# Patient Record
Sex: Female | Born: 1940
Health system: Southern US, Community
[De-identification: ages and names within clinical notes are randomized; demographics above are authoritative.]

## PROBLEM LIST (undated history)

## (undated) DIAGNOSIS — M1990 Unspecified osteoarthritis, unspecified site: Secondary | ICD-10-CM

## (undated) DIAGNOSIS — M797 Fibromyalgia: Secondary | ICD-10-CM

## (undated) DIAGNOSIS — E559 Vitamin D deficiency, unspecified: Secondary | ICD-10-CM

## (undated) DIAGNOSIS — E039 Hypothyroidism, unspecified: Secondary | ICD-10-CM

## (undated) DIAGNOSIS — M8580 Other specified disorders of bone density and structure, unspecified site: Secondary | ICD-10-CM

## (undated) DIAGNOSIS — I10 Essential (primary) hypertension: Secondary | ICD-10-CM

## (undated) DIAGNOSIS — J45909 Unspecified asthma, uncomplicated: Secondary | ICD-10-CM

## (undated) DIAGNOSIS — K219 Gastro-esophageal reflux disease without esophagitis: Secondary | ICD-10-CM

## (undated) HISTORY — PX: EYE SURGERY: SHX253

## (undated) HISTORY — PX: TUBAL LIGATION: SHX77

## (undated) HISTORY — DX: Vitamin D deficiency, unspecified: E55.9

## (undated) HISTORY — DX: Fibromyalgia: M79.7

## (undated) HISTORY — DX: Unspecified asthma, uncomplicated: J45.909

## (undated) HISTORY — DX: Gastro-esophageal reflux disease without esophagitis: K21.9

## (undated) HISTORY — DX: Essential (primary) hypertension: I10

## (undated) HISTORY — DX: Other specified disorders of bone density and structure, unspecified site: M85.80

## (undated) HISTORY — DX: Unspecified osteoarthritis, unspecified site: M19.90

## (undated) HISTORY — PX: APPENDECTOMY: SHX54

---

## 2008-10-14 DIAGNOSIS — H4089 Other specified glaucoma: Secondary | ICD-10-CM | POA: Insufficient documentation

## 2014-06-03 DIAGNOSIS — H18459 Nodular corneal degeneration, unspecified eye: Secondary | ICD-10-CM | POA: Insufficient documentation

## 2014-09-07 DIAGNOSIS — Z531 Procedure and treatment not carried out because of patient's decision for reasons of belief and group pressure: Secondary | ICD-10-CM | POA: Insufficient documentation

## 2014-10-19 DIAGNOSIS — H402223 Chronic angle-closure glaucoma, left eye, severe stage: Secondary | ICD-10-CM | POA: Diagnosis not present

## 2014-10-19 DIAGNOSIS — H2512 Age-related nuclear cataract, left eye: Secondary | ICD-10-CM | POA: Diagnosis not present

## 2014-11-11 DIAGNOSIS — J209 Acute bronchitis, unspecified: Secondary | ICD-10-CM | POA: Diagnosis not present

## 2014-11-11 DIAGNOSIS — J4521 Mild intermittent asthma with (acute) exacerbation: Secondary | ICD-10-CM | POA: Diagnosis not present

## 2014-11-16 DIAGNOSIS — M8580 Other specified disorders of bone density and structure, unspecified site: Secondary | ICD-10-CM | POA: Diagnosis not present

## 2014-11-16 DIAGNOSIS — J45909 Unspecified asthma, uncomplicated: Secondary | ICD-10-CM | POA: Diagnosis not present

## 2014-11-16 DIAGNOSIS — E559 Vitamin D deficiency, unspecified: Secondary | ICD-10-CM | POA: Diagnosis not present

## 2014-11-16 DIAGNOSIS — Z0000 Encounter for general adult medical examination without abnormal findings: Secondary | ICD-10-CM | POA: Diagnosis not present

## 2014-11-16 DIAGNOSIS — E780 Pure hypercholesterolemia: Secondary | ICD-10-CM | POA: Diagnosis not present

## 2014-11-16 DIAGNOSIS — R5383 Other fatigue: Secondary | ICD-10-CM | POA: Diagnosis not present

## 2014-11-16 DIAGNOSIS — R5381 Other malaise: Secondary | ICD-10-CM | POA: Diagnosis not present

## 2014-11-16 DIAGNOSIS — Z23 Encounter for immunization: Secondary | ICD-10-CM | POA: Diagnosis not present

## 2015-02-07 DIAGNOSIS — H409 Unspecified glaucoma: Secondary | ICD-10-CM | POA: Diagnosis not present

## 2015-02-07 DIAGNOSIS — M533 Sacrococcygeal disorders, not elsewhere classified: Secondary | ICD-10-CM | POA: Diagnosis not present

## 2015-02-07 DIAGNOSIS — M797 Fibromyalgia: Secondary | ICD-10-CM | POA: Diagnosis not present

## 2015-02-07 DIAGNOSIS — M47812 Spondylosis without myelopathy or radiculopathy, cervical region: Secondary | ICD-10-CM | POA: Diagnosis not present

## 2015-02-07 DIAGNOSIS — M150 Primary generalized (osteo)arthritis: Secondary | ICD-10-CM | POA: Diagnosis not present

## 2015-02-07 DIAGNOSIS — M47816 Spondylosis without myelopathy or radiculopathy, lumbar region: Secondary | ICD-10-CM | POA: Diagnosis not present

## 2015-02-23 DIAGNOSIS — H4053X3 Glaucoma secondary to other eye disorders, bilateral, severe stage: Secondary | ICD-10-CM | POA: Diagnosis not present

## 2015-02-28 DIAGNOSIS — M533 Sacrococcygeal disorders, not elsewhere classified: Secondary | ICD-10-CM | POA: Diagnosis not present

## 2015-03-21 DIAGNOSIS — M533 Sacrococcygeal disorders, not elsewhere classified: Secondary | ICD-10-CM | POA: Diagnosis not present

## 2015-03-21 DIAGNOSIS — M47816 Spondylosis without myelopathy or radiculopathy, lumbar region: Secondary | ICD-10-CM | POA: Diagnosis not present

## 2015-03-22 DIAGNOSIS — H4011X3 Primary open-angle glaucoma, severe stage: Secondary | ICD-10-CM | POA: Diagnosis not present

## 2015-03-22 DIAGNOSIS — M1990 Unspecified osteoarthritis, unspecified site: Secondary | ICD-10-CM | POA: Diagnosis not present

## 2015-03-22 DIAGNOSIS — M797 Fibromyalgia: Secondary | ICD-10-CM | POA: Diagnosis not present

## 2015-03-22 DIAGNOSIS — H4011X0 Primary open-angle glaucoma, stage unspecified: Secondary | ICD-10-CM | POA: Diagnosis not present

## 2015-03-22 DIAGNOSIS — Z87891 Personal history of nicotine dependence: Secondary | ICD-10-CM | POA: Diagnosis not present

## 2015-03-22 DIAGNOSIS — J45909 Unspecified asthma, uncomplicated: Secondary | ICD-10-CM | POA: Diagnosis not present

## 2015-03-22 DIAGNOSIS — K219 Gastro-esophageal reflux disease without esophagitis: Secondary | ICD-10-CM | POA: Diagnosis not present

## 2015-03-22 DIAGNOSIS — H2512 Age-related nuclear cataract, left eye: Secondary | ICD-10-CM | POA: Diagnosis not present

## 2015-03-22 DIAGNOSIS — I10 Essential (primary) hypertension: Secondary | ICD-10-CM | POA: Diagnosis not present

## 2015-03-22 DIAGNOSIS — M810 Age-related osteoporosis without current pathological fracture: Secondary | ICD-10-CM | POA: Diagnosis not present

## 2015-06-20 DIAGNOSIS — Z961 Presence of intraocular lens: Secondary | ICD-10-CM | POA: Diagnosis not present

## 2015-06-20 DIAGNOSIS — H4053X3 Glaucoma secondary to other eye disorders, bilateral, severe stage: Secondary | ICD-10-CM | POA: Diagnosis not present

## 2015-07-20 DIAGNOSIS — E559 Vitamin D deficiency, unspecified: Secondary | ICD-10-CM | POA: Diagnosis not present

## 2015-07-20 DIAGNOSIS — Z23 Encounter for immunization: Secondary | ICD-10-CM | POA: Diagnosis not present

## 2015-07-20 DIAGNOSIS — Z1389 Encounter for screening for other disorder: Secondary | ICD-10-CM | POA: Diagnosis not present

## 2015-07-20 DIAGNOSIS — E782 Mixed hyperlipidemia: Secondary | ICD-10-CM | POA: Diagnosis not present

## 2015-07-20 DIAGNOSIS — J45909 Unspecified asthma, uncomplicated: Secondary | ICD-10-CM | POA: Diagnosis not present

## 2015-07-20 DIAGNOSIS — Z9181 History of falling: Secondary | ICD-10-CM | POA: Diagnosis not present

## 2015-08-01 DIAGNOSIS — B349 Viral infection, unspecified: Secondary | ICD-10-CM | POA: Diagnosis not present

## 2015-08-01 DIAGNOSIS — J029 Acute pharyngitis, unspecified: Secondary | ICD-10-CM | POA: Diagnosis not present

## 2015-09-29 DIAGNOSIS — R109 Unspecified abdominal pain: Secondary | ICD-10-CM | POA: Diagnosis not present

## 2015-09-29 DIAGNOSIS — F419 Anxiety disorder, unspecified: Secondary | ICD-10-CM | POA: Diagnosis not present

## 2015-10-10 DIAGNOSIS — R1032 Left lower quadrant pain: Secondary | ICD-10-CM | POA: Diagnosis not present

## 2016-02-06 DIAGNOSIS — M533 Sacrococcygeal disorders, not elsewhere classified: Secondary | ICD-10-CM | POA: Diagnosis not present

## 2016-02-06 DIAGNOSIS — M47816 Spondylosis without myelopathy or radiculopathy, lumbar region: Secondary | ICD-10-CM | POA: Diagnosis not present

## 2016-02-06 DIAGNOSIS — M797 Fibromyalgia: Secondary | ICD-10-CM | POA: Diagnosis not present

## 2016-02-16 DIAGNOSIS — M533 Sacrococcygeal disorders, not elsewhere classified: Secondary | ICD-10-CM | POA: Diagnosis not present

## 2016-03-20 DIAGNOSIS — Z79899 Other long term (current) drug therapy: Secondary | ICD-10-CM | POA: Diagnosis not present

## 2016-03-20 DIAGNOSIS — R5383 Other fatigue: Secondary | ICD-10-CM | POA: Diagnosis not present

## 2016-03-20 DIAGNOSIS — E559 Vitamin D deficiency, unspecified: Secondary | ICD-10-CM | POA: Diagnosis not present

## 2016-03-20 DIAGNOSIS — H8120 Vestibular neuronitis, unspecified ear: Secondary | ICD-10-CM | POA: Diagnosis not present

## 2016-03-20 DIAGNOSIS — Z0000 Encounter for general adult medical examination without abnormal findings: Secondary | ICD-10-CM | POA: Diagnosis not present

## 2016-05-06 DIAGNOSIS — M797 Fibromyalgia: Secondary | ICD-10-CM | POA: Diagnosis not present

## 2016-05-06 DIAGNOSIS — M533 Sacrococcygeal disorders, not elsewhere classified: Secondary | ICD-10-CM | POA: Diagnosis not present

## 2016-05-06 DIAGNOSIS — M47816 Spondylosis without myelopathy or radiculopathy, lumbar region: Secondary | ICD-10-CM | POA: Diagnosis not present

## 2016-05-06 DIAGNOSIS — M150 Primary generalized (osteo)arthritis: Secondary | ICD-10-CM | POA: Diagnosis not present

## 2016-05-15 DIAGNOSIS — B079 Viral wart, unspecified: Secondary | ICD-10-CM | POA: Diagnosis not present

## 2016-05-27 DIAGNOSIS — M533 Sacrococcygeal disorders, not elsewhere classified: Secondary | ICD-10-CM | POA: Diagnosis not present

## 2016-06-05 DIAGNOSIS — B079 Viral wart, unspecified: Secondary | ICD-10-CM | POA: Diagnosis not present

## 2016-06-19 DIAGNOSIS — B079 Viral wart, unspecified: Secondary | ICD-10-CM | POA: Diagnosis not present

## 2016-07-05 DIAGNOSIS — R05 Cough: Secondary | ICD-10-CM | POA: Diagnosis not present

## 2016-07-15 DIAGNOSIS — L301 Dyshidrosis [pompholyx]: Secondary | ICD-10-CM | POA: Diagnosis not present

## 2016-08-12 DIAGNOSIS — B079 Viral wart, unspecified: Secondary | ICD-10-CM | POA: Diagnosis not present

## 2016-08-13 DIAGNOSIS — E559 Vitamin D deficiency, unspecified: Secondary | ICD-10-CM | POA: Diagnosis not present

## 2016-08-13 DIAGNOSIS — Z1389 Encounter for screening for other disorder: Secondary | ICD-10-CM | POA: Diagnosis not present

## 2016-08-13 DIAGNOSIS — Z9181 History of falling: Secondary | ICD-10-CM | POA: Diagnosis not present

## 2016-08-13 DIAGNOSIS — N289 Disorder of kidney and ureter, unspecified: Secondary | ICD-10-CM | POA: Diagnosis not present

## 2016-09-09 DIAGNOSIS — R69 Illness, unspecified: Secondary | ICD-10-CM | POA: Diagnosis not present

## 2016-09-23 DIAGNOSIS — H4053X3 Glaucoma secondary to other eye disorders, bilateral, severe stage: Secondary | ICD-10-CM | POA: Diagnosis not present

## 2016-10-16 DIAGNOSIS — R69 Illness, unspecified: Secondary | ICD-10-CM | POA: Diagnosis not present

## 2016-10-18 DIAGNOSIS — J069 Acute upper respiratory infection, unspecified: Secondary | ICD-10-CM | POA: Diagnosis not present

## 2016-10-18 DIAGNOSIS — J209 Acute bronchitis, unspecified: Secondary | ICD-10-CM | POA: Diagnosis not present

## 2016-10-18 DIAGNOSIS — J45909 Unspecified asthma, uncomplicated: Secondary | ICD-10-CM | POA: Diagnosis not present

## 2016-10-18 DIAGNOSIS — R509 Fever, unspecified: Secondary | ICD-10-CM | POA: Diagnosis not present

## 2017-02-19 DIAGNOSIS — Z6825 Body mass index (BMI) 25.0-25.9, adult: Secondary | ICD-10-CM | POA: Diagnosis not present

## 2017-02-19 DIAGNOSIS — Z79899 Other long term (current) drug therapy: Secondary | ICD-10-CM | POA: Diagnosis not present

## 2017-02-19 DIAGNOSIS — M797 Fibromyalgia: Secondary | ICD-10-CM | POA: Diagnosis not present

## 2017-02-19 DIAGNOSIS — Z13220 Encounter for screening for lipoid disorders: Secondary | ICD-10-CM | POA: Diagnosis not present

## 2017-02-19 DIAGNOSIS — D510 Vitamin B12 deficiency anemia due to intrinsic factor deficiency: Secondary | ICD-10-CM | POA: Diagnosis not present

## 2017-03-24 DIAGNOSIS — H4053X3 Glaucoma secondary to other eye disorders, bilateral, severe stage: Secondary | ICD-10-CM | POA: Diagnosis not present

## 2017-03-24 DIAGNOSIS — H02412 Mechanical ptosis of left eyelid: Secondary | ICD-10-CM | POA: Diagnosis not present

## 2017-04-08 DIAGNOSIS — Z1231 Encounter for screening mammogram for malignant neoplasm of breast: Secondary | ICD-10-CM | POA: Diagnosis not present

## 2017-07-10 DIAGNOSIS — H02412 Mechanical ptosis of left eyelid: Secondary | ICD-10-CM | POA: Diagnosis not present

## 2017-07-25 DIAGNOSIS — M79609 Pain in unspecified limb: Secondary | ICD-10-CM | POA: Diagnosis not present

## 2017-07-25 DIAGNOSIS — R0789 Other chest pain: Secondary | ICD-10-CM | POA: Diagnosis not present

## 2017-07-25 DIAGNOSIS — Z6825 Body mass index (BMI) 25.0-25.9, adult: Secondary | ICD-10-CM | POA: Diagnosis not present

## 2017-07-25 DIAGNOSIS — M25531 Pain in right wrist: Secondary | ICD-10-CM | POA: Diagnosis not present

## 2017-07-25 DIAGNOSIS — M545 Low back pain: Secondary | ICD-10-CM | POA: Diagnosis not present

## 2017-09-30 DIAGNOSIS — Z23 Encounter for immunization: Secondary | ICD-10-CM | POA: Diagnosis not present

## 2018-08-20 DIAGNOSIS — K219 Gastro-esophageal reflux disease without esophagitis: Secondary | ICD-10-CM | POA: Insufficient documentation

## 2018-08-20 DIAGNOSIS — M797 Fibromyalgia: Secondary | ICD-10-CM | POA: Insufficient documentation

## 2018-09-23 DIAGNOSIS — M47812 Spondylosis without myelopathy or radiculopathy, cervical region: Secondary | ICD-10-CM | POA: Insufficient documentation

## 2018-09-23 DIAGNOSIS — E538 Deficiency of other specified B group vitamins: Secondary | ICD-10-CM | POA: Insufficient documentation

## 2018-09-23 DIAGNOSIS — I10 Essential (primary) hypertension: Secondary | ICD-10-CM | POA: Insufficient documentation

## 2018-09-23 DIAGNOSIS — E559 Vitamin D deficiency, unspecified: Secondary | ICD-10-CM | POA: Insufficient documentation

## 2018-12-07 DIAGNOSIS — E038 Other specified hypothyroidism: Secondary | ICD-10-CM | POA: Insufficient documentation

## 2021-06-12 DIAGNOSIS — E559 Vitamin D deficiency, unspecified: Secondary | ICD-10-CM | POA: Diagnosis not present

## 2021-06-12 DIAGNOSIS — G629 Polyneuropathy, unspecified: Secondary | ICD-10-CM | POA: Diagnosis not present

## 2021-06-12 DIAGNOSIS — R600 Localized edema: Secondary | ICD-10-CM | POA: Diagnosis not present

## 2021-06-12 DIAGNOSIS — K219 Gastro-esophageal reflux disease without esophagitis: Secondary | ICD-10-CM | POA: Diagnosis not present

## 2021-06-12 DIAGNOSIS — E538 Deficiency of other specified B group vitamins: Secondary | ICD-10-CM | POA: Diagnosis not present

## 2021-06-12 DIAGNOSIS — E038 Other specified hypothyroidism: Secondary | ICD-10-CM | POA: Diagnosis not present

## 2021-06-12 DIAGNOSIS — H4089 Other specified glaucoma: Secondary | ICD-10-CM | POA: Diagnosis not present

## 2021-06-12 DIAGNOSIS — I10 Essential (primary) hypertension: Secondary | ICD-10-CM | POA: Diagnosis not present

## 2021-06-12 DIAGNOSIS — M1990 Unspecified osteoarthritis, unspecified site: Secondary | ICD-10-CM | POA: Diagnosis not present

## 2021-06-14 ENCOUNTER — Ambulatory Visit: Payer: Self-pay | Admitting: Allergy & Immunology

## 2021-06-28 ENCOUNTER — Other Ambulatory Visit: Payer: Self-pay

## 2021-06-28 ENCOUNTER — Ambulatory Visit (INDEPENDENT_AMBULATORY_CARE_PROVIDER_SITE_OTHER): Payer: Medicare HMO | Admitting: Allergy & Immunology

## 2021-06-28 ENCOUNTER — Encounter: Payer: Self-pay | Admitting: Allergy & Immunology

## 2021-06-28 VITALS — BP 132/62 | HR 54 | Temp 97.6°F | Resp 18 | Ht 61.0 in | Wt 133.1 lb

## 2021-06-28 DIAGNOSIS — J3089 Other allergic rhinitis: Secondary | ICD-10-CM | POA: Diagnosis not present

## 2021-06-28 DIAGNOSIS — J302 Other seasonal allergic rhinitis: Secondary | ICD-10-CM | POA: Diagnosis not present

## 2021-06-28 DIAGNOSIS — J4520 Mild intermittent asthma, uncomplicated: Secondary | ICD-10-CM

## 2021-06-28 MED ORDER — TRIAMCINOLONE ACETONIDE 55 MCG/ACT NA AERO: 2.0000 | INHALATION_SPRAY | Freq: Every day | NASAL | 5 refills | Status: DC

## 2021-06-28 MED ORDER — LEVOCETIRIZINE DIHYDROCHLORIDE 5 MG PO TABS: 5.0000 mg | ORAL_TABLET | Freq: Two times a day (BID) | ORAL | 5 refills | Status: DC | PRN

## 2021-06-28 NOTE — Patient Instructions (Addendum)
1. Seasonal and perennial allergic rhinitis - Testing today showed: indoor molds and outdoor molds - Copy of test results provided.  - Avoidance measures provided. - Start taking: Xyzal (levocetirizine) '5mg'$  tablet once daily AS NEEDED and Nasacort (triamcinolone) one spray per nostril daily AS NEEDED. - You can use an extra dose of the antihistamine, if needed, for breakthrough symptoms.  - Consider nasal saline rinses 1-2 times daily to remove allergens from the nasal cavities as well as help with mucous clearance (this is especially helpful to do before the nasal sprays are given) - Consider allergy shots as a means of long-term control. - Allergy shots "re-train" and "reset" the immune system to ignore environmental allergens and decrease the resulting immune response to those allergens (sneezing, itchy watery eyes, runny nose, nasal congestion, etc).    - Allergy shots improve symptoms in 75-85% of patients.  - We can discuss more at the next appointment if the medications are not working for you.  2. Return in about 6 months (around 12/26/2021).    Please inform us of any Emergency Department visits, hospitalizations, or changes in symptoms. Call us before going to the ED for breathing or allergy symptoms since we might be able to fit you in for a sick visit. Feel free to contact us anytime with any questions, problems, or concerns.  It was a pleasure to meet you and your wonderful husband today!  Websites that have reliable patient information: 1. American Academy of Asthma, Allergy, and Immunology: www.aaaai.org 2. Food Allergy Research and Education (FARE): foodallergy.org 3. Mothers of Asthmatics: http://www.asthmacommunitynetwork.org 4. American College of Allergy, Asthma, and Immunology: www.acaai.org   COVID-19 Vaccine Information can be found at: ShippingScam.co.uk For questions related to vaccine distribution or  appointments, please email [email or web address] or call [phone].   We realize that you might be concerned about having an allergic reaction to the COVID19 vaccines. To help with that concern, WE ARE OFFERING THE COVID19 VACCINES IN OUR OFFICE! Ask the front desk for dates!     "Like" [place] on Facebook and Instagram for our latest updates!      A healthy democracy works best when [employer] participate! Make sure you are registered to vote! If you have moved or changed any of your contact information, you will need to get this updated before voting!  In some cases, you MAY be able to register to vote online: [email or web address]     Control of [place] and fungi can grow on a variety of surfaces provided certain temperature and moisture conditions exist.  Outdoor molds grow on plants, decaying vegetation and soil.  The major outdoor mold, Alternaria and Cladosporium, are found in very high numbers during hot and dry conditions.  Generally, a late Summer - Fall peak is seen for common outdoor fungal spores.  Rain will temporarily lower outdoor mold spore count, but counts rise rapidly when the rainy period ends.  The most important indoor molds are Aspergillus and Penicillium.  Dark, humid and poorly ventilated basements are ideal sites for mold growth.  The next most common sites of mold growth are the bathroom and the kitchen.  Outdoor (Seasonal) Mold Control   Use air conditioning and keep windows closed Avoid exposure to decaying vegetation. Avoid leaf raking. Avoid grain handling. Consider wearing a face mask if working in moldy areas.    Indoor (Perennial) Mold Control    Maintain humidity below 50%. Clean washable surfaces with 5% bleach solution. Remove sources e.g. contaminated  carpets.    Allergy Shots   Allergies are the result of a chain reaction that starts in the immune system. Your immune system controls how your body  defends itself. For instance, if you have an allergy to pollen, your immune system identifies pollen as an invader or allergen. Your immune system overreacts by producing antibodies called Immunoglobulin E (IgE). These antibodies travel to cells that release chemicals, causing an allergic reaction.  The concept behind allergy immunotherapy, whether it is received in the form of shots or tablets, is that the immune system can be desensitized to specific allergens that trigger allergy symptoms. Although it requires time and patience, the payback can be long-term relief.  How Do Allergy Shots Work?  Allergy shots work much like a vaccine. Your body responds to injected amounts of a particular allergen given in increasing doses, eventually developing a resistance and tolerance to it. Allergy shots can lead to decreased, minimal or no allergy symptoms.  There generally are two phases: build-up and maintenance. Build-up often ranges from three to six months and involves receiving injections with increasing amounts of the allergens. The shots are typically given once or twice a week, though more rapid build-up schedules are sometimes used.  The maintenance phase begins when the most effective dose is reached. This dose is different for each person, depending on how allergic you are and your response to the build-up injections. Once the maintenance dose is reached, there are longer periods between injections, typically two to four weeks.  Occasionally doctors give cortisone-type shots that can temporarily reduce allergy symptoms. These types of shots are different and should not be confused with allergy immunotherapy shots.  Who Can Be Treated with Allergy Shots?  Allergy shots may be a good treatment approach for people with allergic rhinitis (hay fever), allergic asthma, conjunctivitis (eye allergy) or stinging insect allergy.   Before deciding to begin allergy shots, you should consider:   The length  of allergy season and the severity of your symptoms  Whether medications and/or changes to your environment can control your symptoms  Your desire to avoid long-term medication use  Time: allergy immunotherapy requires a major time commitment  Cost: may vary depending on your insurance coverage  Allergy shots for children age 68 and older are effective and often well tolerated. They might prevent the onset of new allergen sensitivities or the progression to asthma.  Allergy shots are not started on patients who are pregnant but can be continued on patients who become pregnant while receiving them. In some patients with other medical conditions or who take certain common medications, allergy shots may be of risk. It is important to mention other medications you talk to your allergist.   When Will I Feel Better?  Some may experience decreased allergy symptoms during the build-up phase. For others, it may take as long as 12 months on the maintenance dose. If there is no improvement after a year of maintenance, your allergist will discuss other treatment options with you.  If you aren't responding to allergy shots, it may be because there is not enough dose of the allergen in your vaccine or there are missing allergens that were not identified during your allergy testing. Other reasons could be that there are high levels of the allergen in your environment or major exposure to non-allergic triggers like tobacco smoke.  What Is the Length of Treatment?  Once the maintenance dose is reached, allergy shots are generally continued for three to five years.  The decision to stop should be discussed with your allergist at that time. Some people may experience a permanent reduction of allergy symptoms. Others may relapse and a longer course of allergy shots can be considered.  What Are the Possible Reactions?  The two types of adverse reactions that can occur with allergy shots are local and systemic.  Common local reactions include very mild redness and swelling at the injection site, which can happen immediately or several hours after. A systemic reaction, which is less common, affects the entire body or a particular body system. They are usually mild and typically respond quickly to medications. Signs include increased allergy symptoms such as sneezing, a stuffy nose or hives.  Rarely, a serious systemic reaction called anaphylaxis can develop. Symptoms include swelling in the throat, wheezing, a feeling of tightness in the chest, nausea or dizziness. Most serious systemic reactions develop within 30 minutes of allergy shots. This is why it is strongly recommended you wait in your doctor's office for 30 minutes after your injections. Your allergist is trained to watch for reactions, and his or her staff is trained and equipped with the proper medications to identify and treat them.  Who Should Administer Allergy Shots?  The preferred location for receiving shots is your prescribing allergist's office. Injections can sometimes be given at another facility where the physician and staff are trained to recognize and treat reactions, and have received instructions by your prescribing allergist.

## 2021-06-28 NOTE — Progress Notes (Signed)
NEW PATIENT  Date of Service/Encounter:  06/28/21  Consult requested by: Angelina Sheriff, MD   Assessment:   Seasonal and perennial allergic rhinitis  Allergic asthma - controlled with albuterol PRN  Plan/Recommendations:  \ 1. Seasonal and perennial allergic rhinitis - Testing today showed: indoor molds and outdoor molds - Copy of test results provided.  - Avoidance measures provided. - Start taking: Xyzal (levocetirizine) '5mg'$  tablet once daily AS NEEDED and Nasacort (triamcinolone) one spray per nostril daily AS NEEDED. - You can use an extra dose of the antihistamine, if needed, for breakthrough symptoms.  - Consider nasal saline rinses 1-2 times daily to remove allergens from the nasal cavities as well as help with mucous clearance (this is especially helpful to do before the nasal sprays are given) - Consider allergy shots as a means of long-term control. - Allergy shots "re-train" and "reset" the immune system to ignore environmental allergens and decrease the resulting immune response to those allergens (sneezing, itchy watery eyes, runny nose, nasal congestion, etc).    - Allergy shots improve symptoms in 75-85% of patients.  - We can discuss more at the next appointment if the medications are not working for you.  2. Return in about 6 months (around 12/26/2021).    This note in its entirety was forwarded to the Provider who requested this consultation.  Subjective:   Jessica Ayers is a 80 y.o. female presenting today for evaluation of  Chief Complaint  Patient presents with   Jessica Ayers has a history of the following: Patient Active Problem List   Diagnosis Date Noted   Extrinsic asthma 06/30/2021   Seasonal and perennial allergic rhinitis 06/30/2021     History obtained from: chart review and patient and her husband .  Jessica Ayers was referred by Angelina Sheriff, MD.     Jessica Ayers is a 80 y.o. female presenting for an  evaluation of environmental allergies .   Asthma/Respiratory Symptom History: She has seasonal asthma. She has a "pump" that she uses as needed.  She does not need prednisone routinely.  She has not been to the hospital nor has she been intubated for breathing issues.  Allergic Rhinitis Symptom History: She has allergic rhinitis symptoms.  This includes runny nose as well as sneezing.  This is normally an issue during the change of seasons.  She has never been tested in the past.  Otherwise, there is no history of other atopic diseases, including food allergies, drug allergies, stinging insect allergies, eczema, urticaria, or contact dermatitis. There is no significant infectious history. Vaccinations are up to date.    Past Medical History: Patient Active Problem List   Diagnosis Date Noted   Extrinsic asthma 06/30/2021   Seasonal and perennial allergic rhinitis 06/30/2021     Medication List:  Allergies as of 06/28/2021       Reactions   Codeine Nausea And Vomiting   Cyclobenzaprine    Lyrica [pregabalin]    Morphine And Related Nausea And Vomiting   Prednisone         Medication List        Accurate as of June 28, 2021 11:59 PM. If you have any questions, ask your nurse or doctor.          albuterol 108 (90 Base) MCG/ACT inhaler Commonly known as: VENTOLIN HFA Inhale 1 puff into the lungs every 4 (four) hours as needed.   amLODipine 10 MG  tablet Commonly known as: NORVASC   gabapentin 100 MG capsule Commonly known as: NEURONTIN Take 100 mg by mouth 4 (four) times daily.   latanoprost 0.005 % ophthalmic solution Commonly known as: XALATAN INSTILL 1 DROP INTO BOTH EYES EVERY NIGHT   levocetirizine 5 MG tablet Commonly known as: XYZAL Take 1 tablet (5 mg total) by mouth 2 (two) times daily as needed for allergies (For breakthrough symptoms). Started by: Valentina Shaggy, MD   levothyroxine 25 MCG tablet Commonly known as: SYNTHROID   omeprazole  20 MG capsule Commonly known as: PRILOSEC   triamcinolone 55 MCG/ACT Aero nasal inhaler Commonly known as: NASACORT Place 2 sprays into the nose daily. Started by: Valentina Shaggy, MD        Birth History: non-contributory  Developmental History: non-contributory  Past Surgical History: Past Surgical History:  Procedure Laterality Date   APPENDECTOMY     EYE SURGERY     TUBAL LIGATION       Family History: Family History  Problem Relation Age of Onset   CAD Father      Social History: Jessica Ayers lives at home with her husband.  They live in an apartment that is 80 years old.  There are rugs throughout the home.  They have electric heating and central cooling.  There are dogs inside of the home.  There are dust mite covers on the bedding.  There is no tobacco exposure.  She is not exposed to fumes, chemicals, or dust.  She does have a HEPA filter.  She does not live near an interstate or industrial area.   Review of Systems  Constitutional: Negative.  Negative for fever, malaise/fatigue and weight loss.  HENT:  Positive for congestion. Negative for ear discharge and ear pain.   Eyes:  Negative for pain, discharge and redness.  Respiratory:  Negative for cough, sputum production, shortness of breath and wheezing.   Cardiovascular: Negative.  Negative for chest pain and palpitations.  Gastrointestinal:  Negative for abdominal pain, heartburn, nausea and vomiting.  Skin: Negative.  Negative for itching and rash.  Neurological:  Negative for dizziness and headaches.  Endo/Heme/Allergies:  Positive for environmental allergies. Does not bruise/bleed easily.      Objective:   Blood pressure 132/62, pulse (!) 54, temperature 97.6 F (36.4 C), temperature source Temporal, resp. rate 18, height '5\' 1"'$  (1.549 m), weight 133 lb 2 oz (60.4 kg), SpO2 97 %. Body mass index is 25.15 kg/m.   Physical Exam:   Physical Exam Constitutional:      Appearance: She is  well-developed.  HENT:     Head: Normocephalic and atraumatic.     Right Ear: Tympanic membrane, ear canal and external ear normal. No drainage, swelling or tenderness. Tympanic membrane is not injected, scarred, erythematous, retracted or bulging.     Left Ear: Tympanic membrane, ear canal and external ear normal. No drainage, swelling or tenderness. Tympanic membrane is not injected, scarred, erythematous, retracted or bulging.     Nose: No nasal deformity, septal deviation, mucosal edema or rhinorrhea.     Right Turbinates: Enlarged, swollen and pale.     Left Turbinates: Enlarged, swollen and pale.     Right Sinus: No maxillary sinus tenderness or frontal sinus tenderness.     Left Sinus: No maxillary sinus tenderness or frontal sinus tenderness.     Mouth/Throat:     Mouth: Mucous membranes are not pale and not dry.     Pharynx: Uvula midline.  Eyes:  General: Allergic shiner present.        Right eye: No discharge.        Left eye: No discharge.     Conjunctiva/sclera: Conjunctivae normal.     Right eye: Right conjunctiva is not injected. No chemosis.    Left eye: Left conjunctiva is not injected. No chemosis.    Pupils: Pupils are equal, round, and reactive to light.  Cardiovascular:     Rate and Rhythm: Normal rate and regular rhythm.     Heart sounds: Normal heart sounds.  Pulmonary:     Effort: Pulmonary effort is normal. No tachypnea, accessory muscle usage or respiratory distress.     Breath sounds: Normal breath sounds. No wheezing, rhonchi or rales.  Chest:     Chest wall: No tenderness.  Abdominal:     Tenderness: There is no abdominal tenderness. There is no guarding or rebound.  Lymphadenopathy:     Head:     Right side of head: No submandibular, tonsillar or occipital adenopathy.     Left side of head: No submandibular, tonsillar or occipital adenopathy.     Cervical: No cervical adenopathy.  Skin:    Coloration: Skin is not pale.     Findings: No abrasion,  erythema, petechiae or rash. Rash is not papular, urticarial or vesicular.  Neurological:     Mental Status: She is alert.     Diagnostic studies:   Allergy Studies:     Airborne Adult Perc - 06/28/21 1511     Time Antigen Placed 1511    Allergen Manufacturer Lavella Hammock    Location Back    Number of Test 59    1. Control-Buffer 50% Glycerol Negative    2. Control-Histamine 1 mg/ml 2+    3. Albumin saline Negative    4. Pecan [place] Negative    5. [place] Negative    6. Johnson Negative    7. Franklin Blue Negative    8. Meadow Fescue Negative    9. Perennial Rye Negative    10. Sweet Vernal Negative    11. Timothy Negative    12. Cocklebur Negative    13. Burweed Marshelder Negative    14. Ragweed, short Negative    16. Plantain,  English Negative    17. Lamb's Quarters Negative    18. Sheep Sorrell Negative    19. Rough Pigweed Negative    21. Mugwort, Common Negative    22. Ash mix Negative    23. Birch mix Negative    24. Beech American Negative    25. Box, Elder Negative    26. Cedar, red Negative    27. Cottonwood, [place] Negative    28. Elm mix Negative    29. Hickory Negative    30. Maple mix Negative    31. Oak, [place] mix Negative    32. Pecan Pollen Negative    33. Pine mix Negative    34. Sycamore Eastern Negative    35. Union, Black Pollen Negative    36. Alternaria alternata Negative    37. Cladosporium Herbarum Negative    38. Aspergillus mix Negative    39. Penicillium mix Negative    40. Bipolaris sorokiniana (Helminthosporium) Negative    41. Drechslera spicifera (Curvularia) Negative    42. Mucor plumbeus Negative    43. Fusarium moniliforme Negative    44. Aureobasidium pullulans (pullulara) Negative    45. Rhizopus oryzae Negative    46. Botrytis cinera Negative    47. Epicoccum nigrum Negative  48. Phoma betae Negative    49. Candida Albicans Negative    50. Trichophyton mentagrophytes Negative    51. Mite, D Farinae  5,000 AU/ml Negative     52. Mite, D Pteronyssinus  5,000 AU/ml Negative    54.  Dog Epithelia Negative    55. Mixed Feathers Negative    56. Horse Epithelia Negative    57. Cockroach, German Negative    58. Mouse Negative    59. Tobacco Leaf Negative             Intradermal - 06/28/21 1600     Time Antigen Placed 1642    Allergen Manufacturer Lavella Hammock    Location Arm    Number of Test 15    Control Negative    [place] Negative    Johnson Negative    7 Grass Negative    Ragweed mix Negative    Weed mix Negative    Tree mix Negative    Mold 1 3+    Mold 2 2+    Mold 3 3+    Mold 4 3+    Cat Negative    Dog Negative    Cockroach Negative    Mite mix Negative             Allergy testing results were read and interpreted by myself, documented by clinical staff.         Salvatore Marvel, MD [facility]

## 2021-06-30 ENCOUNTER — Encounter: Payer: Self-pay | Admitting: Allergy & Immunology

## 2021-06-30 DIAGNOSIS — J45909 Unspecified asthma, uncomplicated: Secondary | ICD-10-CM | POA: Insufficient documentation

## 2021-06-30 DIAGNOSIS — J3089 Other allergic rhinitis: Secondary | ICD-10-CM | POA: Insufficient documentation

## 2021-06-30 DIAGNOSIS — J302 Other seasonal allergic rhinitis: Secondary | ICD-10-CM | POA: Insufficient documentation

## 2021-08-08 DIAGNOSIS — I10 Essential (primary) hypertension: Secondary | ICD-10-CM | POA: Diagnosis not present

## 2021-08-09 DIAGNOSIS — I493 Ventricular premature depolarization: Secondary | ICD-10-CM | POA: Diagnosis not present

## 2021-08-09 DIAGNOSIS — I10 Essential (primary) hypertension: Secondary | ICD-10-CM | POA: Diagnosis not present

## 2021-08-10 DIAGNOSIS — I6523 Occlusion and stenosis of bilateral carotid arteries: Secondary | ICD-10-CM | POA: Diagnosis not present

## 2021-08-10 DIAGNOSIS — I10 Essential (primary) hypertension: Secondary | ICD-10-CM | POA: Diagnosis not present

## 2021-08-15 DIAGNOSIS — I10 Essential (primary) hypertension: Secondary | ICD-10-CM | POA: Diagnosis not present

## 2021-08-21 DIAGNOSIS — I10 Essential (primary) hypertension: Secondary | ICD-10-CM | POA: Diagnosis not present

## 2021-08-21 DIAGNOSIS — R001 Bradycardia, unspecified: Secondary | ICD-10-CM | POA: Diagnosis not present

## 2021-08-24 DIAGNOSIS — M170 Bilateral primary osteoarthritis of knee: Secondary | ICD-10-CM | POA: Diagnosis not present

## 2021-09-05 DIAGNOSIS — I10 Essential (primary) hypertension: Secondary | ICD-10-CM | POA: Diagnosis not present

## 2021-09-10 DIAGNOSIS — I471 Supraventricular tachycardia: Secondary | ICD-10-CM | POA: Diagnosis not present

## 2021-09-10 DIAGNOSIS — I4891 Unspecified atrial fibrillation: Secondary | ICD-10-CM | POA: Diagnosis not present

## 2021-09-12 DIAGNOSIS — E038 Other specified hypothyroidism: Secondary | ICD-10-CM | POA: Diagnosis not present

## 2021-09-12 DIAGNOSIS — K219 Gastro-esophageal reflux disease without esophagitis: Secondary | ICD-10-CM | POA: Diagnosis not present

## 2021-09-12 DIAGNOSIS — I10 Essential (primary) hypertension: Secondary | ICD-10-CM | POA: Diagnosis not present

## 2021-09-12 DIAGNOSIS — G629 Polyneuropathy, unspecified: Secondary | ICD-10-CM | POA: Diagnosis not present

## 2021-09-12 DIAGNOSIS — M1990 Unspecified osteoarthritis, unspecified site: Secondary | ICD-10-CM | POA: Diagnosis not present

## 2021-09-12 DIAGNOSIS — E538 Deficiency of other specified B group vitamins: Secondary | ICD-10-CM | POA: Diagnosis not present

## 2021-09-12 DIAGNOSIS — G894 Chronic pain syndrome: Secondary | ICD-10-CM | POA: Diagnosis not present

## 2021-09-12 DIAGNOSIS — E559 Vitamin D deficiency, unspecified: Secondary | ICD-10-CM | POA: Diagnosis not present

## 2021-11-23 DIAGNOSIS — K219 Gastro-esophageal reflux disease without esophagitis: Secondary | ICD-10-CM | POA: Diagnosis not present

## 2021-11-23 DIAGNOSIS — G629 Polyneuropathy, unspecified: Secondary | ICD-10-CM | POA: Diagnosis not present

## 2021-11-23 DIAGNOSIS — M1990 Unspecified osteoarthritis, unspecified site: Secondary | ICD-10-CM | POA: Diagnosis not present

## 2021-11-23 DIAGNOSIS — M5136 Other intervertebral disc degeneration, lumbar region: Secondary | ICD-10-CM | POA: Diagnosis not present

## 2021-11-23 DIAGNOSIS — I10 Essential (primary) hypertension: Secondary | ICD-10-CM | POA: Diagnosis not present

## 2021-11-23 DIAGNOSIS — Z0000 Encounter for general adult medical examination without abnormal findings: Secondary | ICD-10-CM | POA: Diagnosis not present

## 2021-11-23 DIAGNOSIS — E538 Deficiency of other specified B group vitamins: Secondary | ICD-10-CM | POA: Diagnosis not present

## 2021-11-23 DIAGNOSIS — E559 Vitamin D deficiency, unspecified: Secondary | ICD-10-CM | POA: Diagnosis not present

## 2021-11-23 DIAGNOSIS — E038 Other specified hypothyroidism: Secondary | ICD-10-CM | POA: Diagnosis not present

## 2021-11-27 ENCOUNTER — Other Ambulatory Visit: Payer: Self-pay | Admitting: Internal Medicine

## 2021-11-27 DIAGNOSIS — Z13820 Encounter for screening for osteoporosis: Secondary | ICD-10-CM

## 2021-12-19 DIAGNOSIS — M1712 Unilateral primary osteoarthritis, left knee: Secondary | ICD-10-CM | POA: Diagnosis not present

## 2021-12-19 DIAGNOSIS — I10 Essential (primary) hypertension: Secondary | ICD-10-CM | POA: Diagnosis not present

## 2021-12-19 DIAGNOSIS — R6889 Other general symptoms and signs: Secondary | ICD-10-CM | POA: Diagnosis not present

## 2021-12-27 ENCOUNTER — Encounter: Payer: Self-pay | Admitting: Allergy & Immunology

## 2021-12-27 ENCOUNTER — Ambulatory Visit: Payer: Medicare HMO | Admitting: Allergy & Immunology

## 2021-12-27 ENCOUNTER — Other Ambulatory Visit: Payer: Self-pay

## 2021-12-27 VITALS — BP 160/80 | HR 69 | Temp 98.0°F | Resp 18 | Ht 61.0 in | Wt 137.0 lb

## 2021-12-27 DIAGNOSIS — J3089 Other allergic rhinitis: Secondary | ICD-10-CM

## 2021-12-27 DIAGNOSIS — J4520 Mild intermittent asthma, uncomplicated: Secondary | ICD-10-CM | POA: Diagnosis not present

## 2021-12-27 NOTE — Patient Instructions (Addendum)
1. Seasonal and perennial allergic rhinitis (indoor molds and outdoor molds) ?- Continue taking: Xyzal (levocetirizine) '5mg'$  tablet once daily AS NEEDED and Nasacort (triamcinolone) one spray per nostril daily AS NEEDED. ? ?2. Return in about 1 year (around 12/28/2022).  ? ? ?Please inform us of any Emergency Department visits, hospitalizations, or changes in symptoms. Call us before going to the ED for breathing or allergy symptoms since we might be able to fit you in for a sick visit. Feel free to contact us anytime with any questions, problems, or concerns. ? ?It was a pleasure to see you again today! ? ?Websites that have reliable patient information: ?1. American Academy of Asthma, Allergy, and Immunology: www.aaaai.org ?2. Food Allergy Research and Education (FARE): foodallergy.org ?3. Mothers of Asthmatics: http://www.asthmacommunitynetwork.org ?4. [employer] of Allergy, Asthma, and Immunology: MonthlyElectricBill.co.uk ? ? ?COVID-19 Vaccine Information can be found at: ShippingScam.co.uk For questions related to vaccine distribution or appointments, please email [email or web address] or call [phone].  ? ?We realize that you might be concerned about having an allergic reaction to the COVID19 vaccines. To help with that concern, WE ARE OFFERING THE COVID19 VACCINES IN OUR OFFICE! Ask the front desk for dates!  ? ? ? ??Like? [place] on Facebook and Instagram for our latest updates!  ?  ? ? ?A healthy democracy works best when [employer] participate! Make sure you are registered to vote! If you have moved or changed any of your contact information, you will need to get this updated before voting! ? ?In some cases, you MAY be able to register to vote online: [email or web address] ? ? ? ? ? ? ? ? ? ?

## 2021-12-27 NOTE — Progress Notes (Addendum)
? ?FOLLOW UP ? ?Date of Service/Encounter:  12/27/21 ? ? ?Assessment:  ? ?Seasonal and perennial allergic rhinitis (indoor molds and outdoor molds) - has failed Claritin, Zyrtec, and Allegra ?  ?Allergic asthma - controlled with albuterol PRN ? ?Plan/Recommendations:  ? ? ?1. Seasonal and perennial allergic rhinitis (indoor molds and outdoor molds) ?- Continue taking: Xyzal (levocetirizine) '5mg'$  tablet once daily AS NEEDED and Nasacort (triamcinolone) one spray per nostril daily AS NEEDED. ? ?2. Return in about 1 year (around 12/28/2022).  ? ? ?Subjective:  ? ?Jessica Ayers is a 81 y.o. female presenting today for follow up of  ?Chief Complaint  ?Patient presents with  ? Seasonal and Perennial allergic rhinitis  ?  6 mth f/u -fine  ? ? ?Jessica Ayers has a history of the following: ?Patient Active Problem List  ? Diagnosis Date Noted  ? Extrinsic asthma 06/30/2021  ? Seasonal and perennial allergic rhinitis 06/30/2021  ? ? ?History obtained from: chart review and patient. ? ?Jessica Ayers is a 81 y.o. female presenting for a follow up visit.  She was last seen in September 2022.  At that time, she had testing that was positive only to indoor and outdoor molds.  We started her on Xyzal and Nasacort as needed. ? ?Since the last visit, she is doing fairly well.  ? ?Asthma/Respiratory Symptom History: She is using her inhaler very rarely - only once this year. She has seasonal allergies that cause her asthma. She has not needed prednisone.  She has not been on a controller medication. ? ?Allergic Rhinitis Symptom History: She remains on her Xyzal and Nasacort.  She has not needed antibiotics at all.  Overall, symptoms are under good control. ? ?Otherwise, there have been no changes to her past medical history, surgical history, family history, or social history. ? ? ? ?Review of Systems  ?Constitutional: Negative.  Negative for fever, malaise/fatigue and weight loss.  ?HENT:  Positive for congestion. Negative for ear  discharge and ear pain.   ?Eyes:  Negative for pain, discharge and redness.  ?Respiratory:  Negative for cough, sputum production, shortness of breath and wheezing.   ?Cardiovascular: Negative.  Negative for chest pain and palpitations.  ?Gastrointestinal:  Negative for abdominal pain, heartburn, nausea and vomiting.  ?Skin: Negative.  Negative for itching and rash.  ?Neurological:  Negative for dizziness and headaches.  ?Endo/Heme/Allergies:  Positive for environmental allergies. Does not bruise/bleed easily.   ? ? ? ?Objective:  ? ?Blood pressure (!) 160/80, pulse 69, temperature 98 ?F (36.7 ?C), resp. rate 18, height '5\' 1"'$  (1.549 m), weight 137 lb (62.1 kg), SpO2 96 %. ?Body mass index is 25.89 kg/m?. ? ? ? ?Physical Exam ?Constitutional:   ?   Appearance: She is well-developed.  ?HENT:  ?   Head: Normocephalic and atraumatic.  ?   Right Ear: Tympanic membrane, ear canal and external ear normal. No drainage, swelling or tenderness. Tympanic membrane is not injected, scarred, erythematous, retracted or bulging.  ?   Left Ear: Tympanic membrane, ear canal and external ear normal. No drainage, swelling or tenderness. Tympanic membrane is not injected, scarred, erythematous, retracted or bulging.  ?   Nose: No nasal deformity, septal deviation, mucosal edema or rhinorrhea.  ?   Right Turbinates: Enlarged, swollen and pale.  ?   Left Turbinates: Enlarged, swollen and pale.  ?   Right Sinus: No maxillary sinus tenderness or frontal sinus tenderness.  ?   Left Sinus: No maxillary sinus tenderness or  frontal sinus tenderness.  ?   Mouth/Throat:  ?   Mouth: Mucous membranes are not pale and not dry.  ?   Pharynx: Uvula midline.  ?Eyes:  ?   General: Allergic shiner present.     ?   Right eye: No discharge.     ?   Left eye: No discharge.  ?   Conjunctiva/sclera: Conjunctivae normal.  ?   Right eye: Right conjunctiva is not injected. No chemosis. ?   Left eye: Left conjunctiva is not injected. No chemosis. ?   Pupils:  Pupils are equal, round, and reactive to light.  ?Cardiovascular:  ?   Rate and Rhythm: Normal rate and regular rhythm.  ?   Heart sounds: Normal heart sounds.  ?Pulmonary:  ?   Effort: Pulmonary effort is normal. No tachypnea, accessory muscle usage or respiratory distress.  ?   Breath sounds: Normal breath sounds. No wheezing, rhonchi or rales.  ?Chest:  ?   Chest wall: No tenderness.  ?Abdominal:  ?   Tenderness: There is no abdominal tenderness. There is no guarding or rebound.  ?Lymphadenopathy:  ?   Head:  ?   Right side of head: No submandibular, tonsillar or occipital adenopathy.  ?   Left side of head: No submandibular, tonsillar or occipital adenopathy.  ?   Cervical: No cervical adenopathy.  ?Skin: ?   Coloration: Skin is not pale.  ?   Findings: No abrasion, erythema, petechiae or rash. Rash is not papular, urticarial or vesicular.  ?Neurological:  ?   Mental Status: She is alert.  ?  ? ?Diagnostic studies: none ? ? ?  ?Salvatore Marvel, MD  ?[facility] [place] [facility] ? ? ? ? ? ? ?

## 2021-12-30 ENCOUNTER — Encounter: Payer: Self-pay | Admitting: Allergy & Immunology

## 2021-12-30 MED ORDER — TRIAMCINOLONE ACETONIDE 55 MCG/ACT NA AERO: 2.0000 | INHALATION_SPRAY | Freq: Every day | NASAL | 2 refills | Status: AC

## 2021-12-30 MED ORDER — LEVOCETIRIZINE DIHYDROCHLORIDE 5 MG PO TABS: 5.0000 mg | ORAL_TABLET | Freq: Two times a day (BID) | ORAL | 3 refills | Status: DC | PRN

## 2021-12-31 DIAGNOSIS — M170 Bilateral primary osteoarthritis of knee: Secondary | ICD-10-CM | POA: Diagnosis not present

## 2022-01-04 DIAGNOSIS — E038 Other specified hypothyroidism: Secondary | ICD-10-CM | POA: Diagnosis not present

## 2022-01-09 ENCOUNTER — Telehealth: Payer: Self-pay | Admitting: *Deleted

## 2022-01-09 NOTE — Telephone Encounter (Signed)
PA has been submitted through CoverMyMeds for Xyzal and has been approved. PA has been sent electronically to patients pharmacy and is good for one year.  ?

## 2022-02-04 DIAGNOSIS — M5136 Other intervertebral disc degeneration, lumbar region: Secondary | ICD-10-CM | POA: Diagnosis not present

## 2022-02-04 DIAGNOSIS — M533 Sacrococcygeal disorders, not elsewhere classified: Secondary | ICD-10-CM | POA: Diagnosis not present

## 2022-02-11 DIAGNOSIS — H3589 Other specified retinal disorders: Secondary | ICD-10-CM | POA: Diagnosis not present

## 2022-02-11 DIAGNOSIS — H4053X3 Glaucoma secondary to other eye disorders, bilateral, severe stage: Secondary | ICD-10-CM | POA: Diagnosis not present

## 2022-02-12 DIAGNOSIS — T148XXA Other injury of unspecified body region, initial encounter: Secondary | ICD-10-CM | POA: Diagnosis not present

## 2022-02-12 DIAGNOSIS — M542 Cervicalgia: Secondary | ICD-10-CM | POA: Diagnosis not present

## 2022-02-12 DIAGNOSIS — S0003XA Contusion of scalp, initial encounter: Secondary | ICD-10-CM | POA: Diagnosis not present

## 2022-02-12 DIAGNOSIS — S0990XA Unspecified injury of head, initial encounter: Secondary | ICD-10-CM | POA: Diagnosis not present

## 2022-02-12 DIAGNOSIS — R519 Headache, unspecified: Secondary | ICD-10-CM | POA: Diagnosis not present

## 2022-02-15 DIAGNOSIS — M533 Sacrococcygeal disorders, not elsewhere classified: Secondary | ICD-10-CM | POA: Diagnosis not present

## 2022-02-22 ENCOUNTER — Other Ambulatory Visit: Payer: Self-pay | Admitting: Internal Medicine

## 2022-02-22 ENCOUNTER — Ambulatory Visit
Admission: RE | Admit: 2022-02-22 | Discharge: 2022-02-22 | Disposition: A | Discharge status: Home / Self Care | Payer: Medicare HMO | Source: Ambulatory Visit | Attending: Internal Medicine | Admitting: Internal Medicine

## 2022-02-22 DIAGNOSIS — S0990XA Unspecified injury of head, initial encounter: Secondary | ICD-10-CM

## 2022-02-22 DIAGNOSIS — S0990XD Unspecified injury of head, subsequent encounter: Secondary | ICD-10-CM | POA: Diagnosis not present

## 2022-02-22 DIAGNOSIS — R519 Headache, unspecified: Secondary | ICD-10-CM | POA: Diagnosis not present

## 2022-02-22 DIAGNOSIS — M542 Cervicalgia: Secondary | ICD-10-CM | POA: Diagnosis not present

## 2022-03-01 DIAGNOSIS — E038 Other specified hypothyroidism: Secondary | ICD-10-CM | POA: Diagnosis not present

## 2022-03-15 DIAGNOSIS — M542 Cervicalgia: Secondary | ICD-10-CM | POA: Diagnosis not present

## 2022-03-15 DIAGNOSIS — M546 Pain in thoracic spine: Secondary | ICD-10-CM | POA: Diagnosis not present

## 2022-03-15 DIAGNOSIS — R2231 Localized swelling, mass and lump, right upper limb: Secondary | ICD-10-CM | POA: Diagnosis not present

## 2022-03-15 DIAGNOSIS — W108XXA Fall (on) (from) other stairs and steps, initial encounter: Secondary | ICD-10-CM | POA: Diagnosis not present

## 2022-03-20 DIAGNOSIS — I471 Supraventricular tachycardia: Secondary | ICD-10-CM | POA: Diagnosis not present

## 2022-03-20 DIAGNOSIS — I10 Essential (primary) hypertension: Secondary | ICD-10-CM | POA: Diagnosis not present

## 2022-04-30 ENCOUNTER — Ambulatory Visit: Payer: Medicare HMO | Attending: Internal Medicine | Admitting: Physical Therapy

## 2022-05-02 ENCOUNTER — Other Ambulatory Visit: Payer: Medicare HMO

## 2022-05-20 ENCOUNTER — Other Ambulatory Visit: Payer: Self-pay | Admitting: Internal Medicine

## 2022-05-20 DIAGNOSIS — Z1231 Encounter for screening mammogram for malignant neoplasm of breast: Secondary | ICD-10-CM

## 2022-05-21 DIAGNOSIS — M1712 Unilateral primary osteoarthritis, left knee: Secondary | ICD-10-CM | POA: Diagnosis not present

## 2022-05-21 DIAGNOSIS — M1711 Unilateral primary osteoarthritis, right knee: Secondary | ICD-10-CM | POA: Diagnosis not present

## 2022-05-28 ENCOUNTER — Ambulatory Visit
Admission: RE | Admit: 2022-05-28 | Discharge: 2022-05-28 | Disposition: A | Discharge status: Home / Self Care | Payer: Medicare HMO | Source: Ambulatory Visit | Attending: Internal Medicine | Admitting: Internal Medicine

## 2022-05-28 DIAGNOSIS — M85852 Other specified disorders of bone density and structure, left thigh: Secondary | ICD-10-CM | POA: Diagnosis not present

## 2022-05-28 DIAGNOSIS — Z780 Asymptomatic menopausal state: Secondary | ICD-10-CM | POA: Diagnosis not present

## 2022-05-28 DIAGNOSIS — Z1231 Encounter for screening mammogram for malignant neoplasm of breast: Secondary | ICD-10-CM

## 2022-05-28 DIAGNOSIS — M810 Age-related osteoporosis without current pathological fracture: Secondary | ICD-10-CM | POA: Diagnosis not present

## 2022-05-28 DIAGNOSIS — Z13820 Encounter for screening for osteoporosis: Secondary | ICD-10-CM

## 2022-05-30 ENCOUNTER — Other Ambulatory Visit: Payer: Self-pay | Admitting: Internal Medicine

## 2022-05-30 DIAGNOSIS — R928 Other abnormal and inconclusive findings on diagnostic imaging of breast: Secondary | ICD-10-CM

## 2022-06-04 DIAGNOSIS — J45998 Other asthma: Secondary | ICD-10-CM | POA: Diagnosis not present

## 2022-06-04 DIAGNOSIS — M170 Bilateral primary osteoarthritis of knee: Secondary | ICD-10-CM | POA: Diagnosis not present

## 2022-06-04 DIAGNOSIS — K219 Gastro-esophageal reflux disease without esophagitis: Secondary | ICD-10-CM | POA: Diagnosis not present

## 2022-06-04 DIAGNOSIS — I10 Essential (primary) hypertension: Secondary | ICD-10-CM | POA: Diagnosis not present

## 2022-06-04 DIAGNOSIS — M5136 Other intervertebral disc degeneration, lumbar region: Secondary | ICD-10-CM | POA: Diagnosis not present

## 2022-06-04 DIAGNOSIS — R921 Mammographic calcification found on diagnostic imaging of breast: Secondary | ICD-10-CM | POA: Diagnosis not present

## 2022-07-05 ENCOUNTER — Other Ambulatory Visit: Payer: Self-pay | Admitting: Internal Medicine

## 2022-07-05 ENCOUNTER — Encounter: Payer: Self-pay | Admitting: Radiology

## 2022-07-05 ENCOUNTER — Ambulatory Visit
Admission: RE | Admit: 2022-07-05 | Discharge: 2022-07-05 | Disposition: A | Discharge status: Home / Self Care | Payer: Medicare HMO | Source: Ambulatory Visit | Attending: Internal Medicine | Admitting: Internal Medicine

## 2022-07-05 DIAGNOSIS — R921 Mammographic calcification found on diagnostic imaging of breast: Secondary | ICD-10-CM

## 2022-07-05 DIAGNOSIS — R928 Other abnormal and inconclusive findings on diagnostic imaging of breast: Secondary | ICD-10-CM

## 2022-07-16 DIAGNOSIS — Z8669 Personal history of other diseases of the nervous system and sense organs: Secondary | ICD-10-CM | POA: Diagnosis not present

## 2022-08-12 DIAGNOSIS — H4053X3 Glaucoma secondary to other eye disorders, bilateral, severe stage: Secondary | ICD-10-CM | POA: Diagnosis not present

## 2022-10-20 ENCOUNTER — Other Ambulatory Visit: Payer: Self-pay | Admitting: Allergy & Immunology

## 2022-10-23 DIAGNOSIS — I10 Essential (primary) hypertension: Secondary | ICD-10-CM | POA: Diagnosis not present

## 2022-11-05 ENCOUNTER — Ambulatory Visit (INDEPENDENT_AMBULATORY_CARE_PROVIDER_SITE_OTHER): Payer: Medicare HMO

## 2022-11-05 ENCOUNTER — Ambulatory Visit: Payer: Medicare HMO | Admitting: Podiatry

## 2022-11-05 DIAGNOSIS — Q828 Other specified congenital malformations of skin: Secondary | ICD-10-CM

## 2022-11-05 DIAGNOSIS — Z01818 Encounter for other preprocedural examination: Secondary | ICD-10-CM | POA: Diagnosis not present

## 2022-11-05 DIAGNOSIS — M79672 Pain in left foot: Secondary | ICD-10-CM

## 2022-11-05 DIAGNOSIS — M216X2 Other acquired deformities of left foot: Secondary | ICD-10-CM | POA: Diagnosis not present

## 2022-11-05 MED ORDER — CLOTRIMAZOLE-BETAMETHASONE 1-0.05 % EX CREA: 1.0000 | TOPICAL_CREAM | Freq: Two times a day (BID) | CUTANEOUS | 3 refills | Status: DC

## 2022-11-05 NOTE — Progress Notes (Signed)
Subjective:  Patient ID: Jessica Ayers, female    DOB: [birth year],  MRN: [id number]  Chief Complaint  Patient presents with   Callouses    82 y.o. female presents with the above complaint.  Patient presents with complaint of left plantarflexed fourth metatarsal.  Patient states that she is having a lot of pain submetatarsal 4.  She has tried some over-the-counter medications and offloading padding protecting none of which has helped.  She would like to discuss treatment options for this.   Review of Systems: Negative except as noted in the HPI. Denies N/V/F/Ch.  Past Medical History:  Diagnosis Date   Asthma    Benign essential hypertension    Esophageal reflux    Fibromyalgia    Osteoarthritis    Osteopenia    Vitamin D deficiency     Current Outpatient Medications:    albuterol (VENTOLIN HFA) 108 (90 Base) MCG/ACT inhaler, Inhale 1 puff into the lungs every 4 (four) hours as needed., Disp: , Rfl:    amLODipine (NORVASC) 10 MG tablet, , Disp: , Rfl:    clotrimazole-betamethasone (LOTRISONE) cream, APPLY TO AFFECTED AREA TWICE DAILY, Disp: 90 g, Rfl: 3   gabapentin (NEURONTIN) 100 MG capsule, Take 100 mg by mouth 4 (four) times daily., Disp: , Rfl:    latanoprost (XALATAN) 0.005 % ophthalmic solution, INSTILL 1 DROP INTO BOTH EYES EVERY NIGHT, Disp: , Rfl:    levocetirizine (XYZAL) 5 MG tablet, TAKE 1 TABLET (5 MG TOTAL) BY MOUTH 2 (TWO) TIMES DAILY AS NEEDED FOR ALLERGIES (FOR BREAKTHROUGH SYMPTOMS)., Disp: 60 tablet, Rfl: 1   levothyroxine (SYNTHROID) 25 MCG tablet, , Disp: , Rfl:    omeprazole (PRILOSEC) 20 MG capsule, , Disp: , Rfl:    triamcinolone (NASACORT) 55 MCG/ACT AERO nasal inhaler, Place 2 sprays into the nose daily., Disp: 16.5 g, Rfl: 2  Social History   Tobacco Use  Smoking Status Never  Smokeless Tobacco Never    Allergies  Allergen Reactions   Codeine Nausea And Vomiting   Cyclobenzaprine    Lyrica [Pregabalin]    Morphine And Related Nausea And  Vomiting   Prednisone    Objective:  There were no vitals filed for this visit. There is no height or weight on file to calculate BMI. Constitutional Well developed. Well nourished.  Vascular Dorsalis pedis pulses palpable bilaterally. Posterior tibial pulses palpable bilaterally. Capillary refill normal to all digits.  No cyanosis or clubbing noted. Pedal hair growth normal.  Neurologic Normal speech. Oriented to person, place, and time. Epicritic sensation to light touch grossly present bilaterally.  Dermatologic Nails well groomed and normal in appearance. No open wounds. No skin lesions.  Orthopedic: Left plantarflexed fourth metatarsal.  Noted.  Pain on palpation submetatarsal 4.  Hyperkeratotic lesion without ulceration noted to the left submet 4   Radiographs: 3 views of skeletally mature adult footPlantarflexed fourth metatarsal noted.  No other bony abnormalities noted.  Some midfoot arthritis noted.  Assessment:   1. Left foot pain    Plan:  Patient was evaluated and treated and all questions answered.  Left fourth plantarflexed metatarsal -All questions and concerns were discussed with the patient in extensive detail.  Given the amount of pain that she is having in the setting of failed conservative care including padding protecting offloading she would benefit from a floating osteotomy of the fourth.  I discussed my preoperative intra postop plan in extensive detail she states understanding like to proceed with surgery. -Informed surgical risk consent was reviewed  and read aloud to the patient.  I reviewed the films.  I have discussed my findings with the patient in great detail.  I have discussed all risks including but not limited to infection, stiffness, scarring, limp, disability, deformity, damage to blood vessels and nerves, numbness, poor healing, need for braces, arthritis, chronic pain, amputation, death.  All benefits and realistic expectations discussed in great  detail.  I have made no promises as to the outcome.  I have provided realistic expectations.  I have offered the patient a 2nd opinion, which they have declined and assured me they preferred to proceed despite the risks   No follow-ups on file.   Left fourth floating osteotomy x-ray on the way xray on the on theway out.Marland Kitchen

## 2022-11-06 ENCOUNTER — Other Ambulatory Visit: Payer: Self-pay | Admitting: Podiatry

## 2022-11-06 DIAGNOSIS — Q828 Other specified congenital malformations of skin: Secondary | ICD-10-CM

## 2022-11-06 DIAGNOSIS — M533 Sacrococcygeal disorders, not elsewhere classified: Secondary | ICD-10-CM | POA: Diagnosis not present

## 2022-11-06 DIAGNOSIS — M79672 Pain in left foot: Secondary | ICD-10-CM

## 2022-11-07 ENCOUNTER — Other Ambulatory Visit: Payer: Self-pay | Admitting: Podiatry

## 2022-11-26 DIAGNOSIS — E559 Vitamin D deficiency, unspecified: Secondary | ICD-10-CM | POA: Diagnosis not present

## 2022-11-26 DIAGNOSIS — M170 Bilateral primary osteoarthritis of knee: Secondary | ICD-10-CM | POA: Diagnosis not present

## 2022-11-26 DIAGNOSIS — J45998 Other asthma: Secondary | ICD-10-CM | POA: Diagnosis not present

## 2022-11-26 DIAGNOSIS — E038 Other specified hypothyroidism: Secondary | ICD-10-CM | POA: Diagnosis not present

## 2022-11-26 DIAGNOSIS — I10 Essential (primary) hypertension: Secondary | ICD-10-CM | POA: Diagnosis not present

## 2022-11-26 DIAGNOSIS — M5136 Other intervertebral disc degeneration, lumbar region: Secondary | ICD-10-CM | POA: Diagnosis not present

## 2022-11-26 DIAGNOSIS — Z0000 Encounter for general adult medical examination without abnormal findings: Secondary | ICD-10-CM | POA: Diagnosis not present

## 2022-11-26 DIAGNOSIS — E7800 Pure hypercholesterolemia, unspecified: Secondary | ICD-10-CM | POA: Diagnosis not present

## 2022-11-26 DIAGNOSIS — K219 Gastro-esophageal reflux disease without esophagitis: Secondary | ICD-10-CM | POA: Diagnosis not present

## 2022-11-26 DIAGNOSIS — E538 Deficiency of other specified B group vitamins: Secondary | ICD-10-CM | POA: Diagnosis not present

## 2022-12-04 DIAGNOSIS — H02832 Dermatochalasis of right lower eyelid: Secondary | ICD-10-CM | POA: Diagnosis not present

## 2022-12-04 DIAGNOSIS — H02423 Myogenic ptosis of bilateral eyelids: Secondary | ICD-10-CM | POA: Insufficient documentation

## 2022-12-04 DIAGNOSIS — H02834 Dermatochalasis of left upper eyelid: Secondary | ICD-10-CM | POA: Diagnosis not present

## 2022-12-04 DIAGNOSIS — H02835 Dermatochalasis of left lower eyelid: Secondary | ICD-10-CM | POA: Diagnosis not present

## 2022-12-04 DIAGNOSIS — H02831 Dermatochalasis of right upper eyelid: Secondary | ICD-10-CM | POA: Diagnosis not present

## 2022-12-06 ENCOUNTER — Ambulatory Visit
Admission: RE | Admit: 2022-12-06 | Discharge: 2022-12-06 | Disposition: A | Discharge status: Home / Self Care | Payer: Medicare HMO | Source: Ambulatory Visit | Attending: Internal Medicine | Admitting: Internal Medicine

## 2022-12-06 ENCOUNTER — Telehealth: Payer: Self-pay | Admitting: Urology

## 2022-12-06 DIAGNOSIS — R921 Mammographic calcification found on diagnostic imaging of breast: Secondary | ICD-10-CM

## 2022-12-06 DIAGNOSIS — C50112 Malignant neoplasm of central portion of left female breast: Secondary | ICD-10-CM | POA: Diagnosis not present

## 2022-12-06 DIAGNOSIS — N6012 Diffuse cystic mastopathy of left breast: Secondary | ICD-10-CM | POA: Diagnosis not present

## 2022-12-06 HISTORY — PX: BREAST BIOPSY: SHX20

## 2022-12-06 NOTE — Telephone Encounter (Signed)
DOS - 12/30/22  METATARSAL OSTEOTOMY 4TH LEFT --- 28308  [facility]  PER [place] WEBSITE FOR CPT CODE 57846 HAS BEEN APPROVED, AUTH # [id number], GOOD FROM 12/30/22 - 03/01/23.  TRACKING ID # [id number]

## 2022-12-09 ENCOUNTER — Other Ambulatory Visit: Payer: Self-pay | Admitting: Internal Medicine

## 2022-12-09 DIAGNOSIS — I10 Essential (primary) hypertension: Secondary | ICD-10-CM | POA: Diagnosis not present

## 2022-12-09 DIAGNOSIS — C50912 Malignant neoplasm of unspecified site of left female breast: Secondary | ICD-10-CM

## 2022-12-10 ENCOUNTER — Other Ambulatory Visit: Payer: Self-pay | Admitting: Allergy & Immunology

## 2022-12-10 DIAGNOSIS — I471 Supraventricular tachycardia, unspecified: Secondary | ICD-10-CM | POA: Diagnosis not present

## 2022-12-16 DIAGNOSIS — H4053X3 Glaucoma secondary to other eye disorders, bilateral, severe stage: Secondary | ICD-10-CM | POA: Diagnosis not present

## 2022-12-17 DIAGNOSIS — M533 Sacrococcygeal disorders, not elsewhere classified: Secondary | ICD-10-CM | POA: Diagnosis not present

## 2022-12-18 ENCOUNTER — Other Ambulatory Visit: Payer: Medicare HMO

## 2022-12-23 ENCOUNTER — Ambulatory Visit: Payer: Self-pay | Admitting: Surgery

## 2022-12-23 ENCOUNTER — Inpatient Hospital Stay: Admission: RE | Admit: 2022-12-23 | Payer: Medicare HMO | Source: Ambulatory Visit

## 2022-12-23 DIAGNOSIS — C50112 Malignant neoplasm of central portion of left female breast: Secondary | ICD-10-CM | POA: Diagnosis not present

## 2022-12-23 DIAGNOSIS — D0512 Intraductal carcinoma in situ of left breast: Secondary | ICD-10-CM | POA: Diagnosis not present

## 2022-12-23 DIAGNOSIS — Z170 Estrogen receptor positive status [ER+]: Secondary | ICD-10-CM | POA: Diagnosis not present

## 2022-12-23 NOTE — H&P (Signed)
Subjective   Chief Complaint: New Patient (New breast ca left ca)     History of Present Illness: Jessica Ayers is a 82 y.o. female who is seen today as an office consultation at the request of Dr. Doristine Bosworth for evaluation of New Patient (New breast ca left ca) .   This is an 82 year old female with no family history of breast cancer who presents after recent screening mammogram showed new calcifications in the central left breast.  This area measured 5.8 x 2.9 x 2.4 cm.  She underwent biopsy of the 2 areas of these calcifications.  1 of these showed invasive ductal carcinoma grade 2, ER/PR positive, HER2 positive, Ki-67 5%.  She has extensive DCIS surrounding the primary tumor.  The other biopsy was negative.    The patient reports no breast problems.  No palpable masses.  No nipple discharge.  No left breast pain.  Review of Systems: A complete review of systems was obtained from the patient.  I have reviewed this information and discussed as appropriate with the patient.  See HPI as well for other ROS.  Review of Systems  Constitutional: Negative.   HENT: Negative.    Eyes: Negative.   Respiratory: Negative.    Cardiovascular: Negative.   Gastrointestinal: Negative.   Genitourinary: Negative.   Musculoskeletal:  Positive for joint pain and myalgias.  Skin: Negative.   Neurological: Negative.   Endo/Heme/Allergies: Negative.   Psychiatric/Behavioral: Negative.        Medical History: Past Medical History:  Diagnosis Date   Anesthesia complication    Heartrate dropped with Jan 2016 eye surgery   Arthritis    Asthma without status asthmaticus    patient notes in Fall   Dizzy    Gets dizzy when she doesn't eat   Fibromyalgia    GERD (gastroesophageal reflux disease)    Glaucoma (increased eye pressure)    Hypertension    Osteoporosis    Patient is Jehovah's Witness    PONV (postoperative nausea and vomiting)    Refusal of blood transfusions as patient is Jehovah's  Witness 09/07/2014    Patient Active Problem List  Diagnosis   Salzmann's nodular dystrophy of both eyes   Nuclear sclerosis of left eye   Nodular degeneration of cornea   Refusal of blood transfusions as patient is Jehovah's Witness   Advance directive on file   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   Dermatochalasis of upper and lower eyelids of both eyes   Intraductal carcinoma in situ of left breast    Past Surgical History:  Procedure Laterality Date   TRABECULECTOMY W/SCARRING Left 10/19/2014   Procedure: TRABECULECTOMY W/SCARRING;  Surgeon: Wallace Cullens, MD;  Location: [facility];  Service: Ophthalmology;  Laterality: Left;  PAT phone call [phone]   COLONOSCOPY     CORNEAL EYE SURGERY     [facility] OS on 06/08/14   LAPAROSCOPIC TUBAL LIGATION     LENS EYE SURGERY     OD 2011     Allergies  Allergen Reactions   Iodine Shortness Of Breath   Codeine Nausea and Rash   Morphine Nausea and Rash    Current Outpatient Medications on File Prior to Visit  Medication Sig Dispense Refill   acetaminophen (TYLENOL) 500 MG tablet Take 500 mg by mouth every 6 (six) hours as needed for Pain.     albuterol 90 mcg/actuation inhaler Inhale 2 inhalations into the lungs every 6 (six) hours as needed for Wheezing.  amLODIPine (NORVASC) 10 MG tablet amlodipine 10 mg tablet     aspirin 81 MG EC tablet Take 81 mg by mouth once daily     bacitracin ophthalmic ointment Place on sutures 3-4x per day on the left upper eyeild 3.5 g 2   celecoxib (CELEBREX) 200 MG capsule Take 200 mg by mouth 2 (two) times daily  3   ciprofloxacin HCl (CILOXAN) 0.3 % ophthalmic solution Place 1 drop into the right eye 4 (four) times daily. 5 mL 0   cyanocobalamin (VITAMIN B12) 500 MCG tablet cyanocobalamin (vit B-12) 500 mcg tablet     dorzolamide-timoloL (COSOPT) 22.3-6.8 mg/mL ophthalmic solution Place 1 drop into both eyes 2 (two) times daily 30 mL 4   ergocalciferol, vitamin D2, 50,000 unit capsule Take  50,000 Units by mouth once a week. Fridays  3   folic acid (FOLVITE) [id number] MCG tablet 1 tablet     FUROsemide (LASIX) 20 MG tablet furosemide 20 mg tablet     gabapentin (NEURONTIN) 100 MG capsule Take 100 mg by mouth 2 (two) times daily.     lansoprazole (PREVACID) 30 MG DR capsule Take 30 mg by mouth every morning  3   latanoprost (XALATAN) 0.005 % ophthalmic solution Place 1 drop into both eyes at bedtime 7.5 mL 4   levothyroxine (SYNTHROID) 25 MCG tablet levothyroxine 25 mcg tablet     losartan (COZAAR) 50 MG tablet      losartan-hydrochlorothiazide (HYZAAR) 50-12.5 mg tablet Take 1 tablet by mouth every morning.  0   meloxicam (MOBIC) 15 MG tablet meloxicam 15 mg tablet  TAKE 1 TABLET BY MOUTH EVERY DAY WITH FOOD     nitroGLYcerin (NITROSTAT) 0.4 MG SL tablet nitroglycerin 0.4 mg sublingual tablet     omeprazole (PRILOSEC) 20 MG DR capsule omeprazole 20 mg capsule,delayed release     potassium citrate (UROCIT-K) 10 mEq ER tablet potassium citrate ER 10 mEq (1,080 mg) tablet,extended release     prednisoLONE acetate (PRED FORTE) 1 % ophthalmic suspension Place 1 drop into the right eye 4 (four) times daily. 10 mL 1   No current facility-administered medications on file prior to visit.    Family History  Problem Relation Age of Onset   Glaucoma Mother    Blindness Mother    Cataracts Mother    Glaucoma Father    Cancer Father    Blindness Other    Glaucoma Sister    Blindness Sister    Cataracts Sister    Macular degeneration Neg Hx    Anesthesia problems Neg Hx      Social History   Tobacco Use  Smoking Status Former   Packs/day: 0.50   Years: 3.00   Additional pack years: 0.00   Total pack years: 1.50   Types: Cigarettes   Quit date: 10/14/1973   Years since quitting: 49.2  Smokeless Tobacco Never     Social History   Socioeconomic History   Marital status: Married  Tobacco Use   Smoking status: Former    Packs/day: 0.50    Years: 3.00    Additional pack  years: 0.00    Total pack years: 1.50    Types: Cigarettes    Quit date: 10/14/1973    Years since quitting: 49.2   Smokeless tobacco: Never  Substance and Sexual Activity   Alcohol use: No    Alcohol/week: 0.0 standard drinks of alcohol   Drug use: No    Objective:    Vitals:   12/23/22 1124  BP: (!) 152/80  Pulse: 80  Temp: 36.6 C (97.8 F)  SpO2: 95%    There is no height or weight on file to calculate BMI.  Physical Exam   Constitutional:  WDWN in NAD, conversant, no obvious deformities; lying in bed comfortably Eyes:  Pupils equal, round; sclera anicteric; moist conjunctiva; no lid lag HENT:  Oral mucosa moist; good dentition  Neck:  No masses palpated, trachea midline; no thyromegaly Lungs:  CTA bilaterally; normal respiratory effort Breasts:  symmetric, no nipple changes; no palpable masses or lymphadenopathy on either side; ecchymosis in the left breast from her recent biopsy CV:  Regular rate and rhythm; no murmurs; extremities well-perfused with no edema Abd:  +bowel sounds, soft, non-tender, no palpable organomegaly; no palpable hernias Musc: Normal gait; no apparent clubbing or cyanosis in extremities Lymphatic:  No palpable cervical or axillary lymphadenopathy Skin:  Warm, dry; no sign of jaundice Psychiatric - alert and oriented x 4; calm mood and affect   Labs, Imaging and Diagnostic Testing:  Diagnosis 1. Breast, left, needle core biopsy, upper central FOCAL INVASIVE MODERATELY DIFFERENTIATED DUCTAL ADENOCARCINOMA, GRADE 2 (3+2+1) EXTENSIVE DUCTAL CARCINOMA IN SITU, INTERMEDIATE NUCLEAR GRADE, SOLID AND CRIBRIFORM TYPES WITH NECROSIS TUBULE FORMATION: SCORE 3 NUCLEAR PLEOMORPHISM: SCORE 2 MITOTIC COUNT: SCORE 1 TOTAL SCORE: 6 OVERALL GRADE: GRADE 2 NEGATIVE FOR ANGIOLYMPHATIC INVASION INVASIVE TUMOR MEASURES 1.25 MM IN GREATEST LINEAR EXTENT DCIS MEASURES 4 MM IN GREATEST LINEAR EXTENT MICROCALCIFICATIONS PRESENT WITHIN DCIS, ADENOSIS AND  FIBROMATOID STROMA FOCAL FIBROMATOID CHANGE AND FIBROCYSTIC CHANGES INCLUDING STROMAL FIBROSIS, ADENOSIS AND USUAL DUCT HYPERPLASIA 2. Breast, left, needle core biopsy, retroareolar, calcifications BENIGN BREAST WITH FIBROCYSTIC CHANGES INCLUDING STROMAL FIBROSIS, CYSTIC DILATATION OF DUCTS AND USUAL DUCT HYPERPLASIA MICROCALCIFICATIONS PRESENT WITHIN BENIGN DUCTS BENIGN STROMA NEGATIVE FOR ATYPIA AND CARCINOMA Diagnosis Note 1. Immunohistochemical stains for the breast prognostic markers have been ordered, and these results will be issued within a subsequent addendum to this report. There may be insufficient invasive tumor (1C) remaining within the deeper sections for these immunohistochemical stains. Case is reviewed by Dr. Chauncey Cruel who concurs with the diagnosis. Diagnoses called to Manuela Schwartz at [facility] by Dr. Alric Seton on 12/09/2022 at 10:14 AM. 1 of 3 FINAL for NICOLETTE, SUITE [id number]) Tobin Chad MD Pathologist, Electronic Signature (Case signed 12/09/2022)  ADDITIONAL INFORMATION: 1. Breast, left, needle core biopsy, upper central PROGNOSTIC INDICATORS Results: IMMUNOHISTOCHEMICAL AND MORPHOMETRIC ANALYSIS PERFORMED MANUALLY The tumor cells are positive for Her2 (3+). Estrogen Receptor: 95%, POSITIVE, STRONG STAINING INTENSITY Progesterone Receptor: 30%, POSITIVE, STRONG STAINING INTENSITY Proliferation Marker Ki67: 5% REFERENCE RANGE ESTROGEN RECEPTOR NEGATIVE 0% POSITIVE =>1% REFERENCE RANGE PROGESTERONE RECEPTOR 2 of 3 FINAL for Smither, Vesna E [id number]) ADDITIONAL INFORMATION:(continued) NEGATIVE 0% POSITIVE =>1% All controls stained appropriately Casimer Lanius MD Pathologist, Electronic Signature ( Signed 12/10/2022)  CLINICAL DATA:  Left breast calcifications on a recent screening mammogram.   EXAM: DIGITAL DIAGNOSTIC UNILATERAL LEFT MAMMOGRAM WITH CAD   TECHNIQUE: Left digital diagnostic mammography was performed.    COMPARISON:  Previous exam(s).   ACR Breast Density Category b: There are scattered areas of fibroglandular density.   FINDINGS: There is a group of pleomorphic calcifications extending from the anterior to the mid to posterior left breast centered in the 12 o'clock position, spanning 5.8 x 2.9 x 2.4 cm. These are new since the patient's last mammogram dated 11/20/2018 in [place], [place].   IMPRESSION: 5.8 cm group of left breast calcifications suspicious for malignancy.   RECOMMENDATION: 3D stereotactic  guided core needle biopsy of the posterior and anterior extents of the 5.8 cm group of calcifications in the left breast. This has been discussed with the patient and scheduled at 12:45 p.m. on 07/19/2022.   I have discussed the findings and recommendations with the patient. If applicable, a reminder letter will be sent to the patient regarding the next appointment.   BI-RADS CATEGORY  4: Suspicious.     Electronically Signed   By: Claudie Revering M.D.   On: 07/05/2022 14:45    Assessment and Plan:  Diagnoses and all orders for this visit:  Malignant neoplasm of central portion of left breast in female, estrogen receptor positive   Intraductal carcinoma in situ of left breast    The patient has a large area of calcifications.  Most of this likely represents DCIS with a small area of invasive ductal carcinoma.  I had a discussion with the patient and her son regarding surgical options.  We discussed mastectomy versus breast conserving therapy.  She is not a good candidate for breast conserving therapy due to the large size of the area of calcifications.  Therefore we will proceed with a left mastectomy.  At this time, sentinel lymph node biopsy is not indicated due to her age.  The surgical procedure has been discussed with the patient.  Potential risks, benefits, alternative treatments, and expected outcomes have been explained.  All of the patient's questions at this time  have been answered.  The likelihood of reaching the patient's treatment goal is good.  The patient understand the proposed surgical procedure and wishes to proceed.  We will refer the patient to oncology for further discussion   Ellwyn Ergle Jearld Adjutant, MD  12/23/2022 12:10 PM

## 2022-12-23 NOTE — H&P (View-Only) (Signed)
 Subjective   Chief Complaint: New Patient (New breast ca left ca)     History of Present Illness: Jessica Ayers is a 81 y.o. female who is seen today as an office consultation at the request of Dr. Pahwani for evaluation of New Patient (New breast ca left ca) .   This is an 81-year-old female with no family history of breast cancer who presents after recent screening mammogram showed new calcifications in the central left breast.  This area measured 5.8 x 2.9 x 2.4 cm.  She underwent biopsy of the 2 areas of these calcifications.  1 of these showed invasive ductal carcinoma grade 2, ER/PR positive, HER2 positive, Ki-67 5%.  She has extensive DCIS surrounding the primary tumor.  The other biopsy was negative.    The patient reports no breast problems.  No palpable masses.  No nipple discharge.  No left breast pain.  Review of Systems: A complete review of systems was obtained from the patient.  I have reviewed this information and discussed as appropriate with the patient.  See HPI as well for other ROS.  Review of Systems  Constitutional: Negative.   HENT: Negative.    Eyes: Negative.   Respiratory: Negative.    Cardiovascular: Negative.   Gastrointestinal: Negative.   Genitourinary: Negative.   Musculoskeletal:  Positive for joint pain and myalgias.  Skin: Negative.   Neurological: Negative.   Endo/Heme/Allergies: Negative.   Psychiatric/Behavioral: Negative.        Medical History: Past Medical History:  Diagnosis Date   Anesthesia complication    Heartrate dropped with Jan 2016 eye surgery   Arthritis    Asthma without status asthmaticus    patient notes in Fall   Dizzy    Gets dizzy when she doesn't eat   Fibromyalgia    GERD (gastroesophageal reflux disease)    Glaucoma (increased eye pressure)    Hypertension    Osteoporosis    Patient is Jehovah's Witness    PONV (postoperative nausea and vomiting)    Refusal of blood transfusions as patient is Jehovah's  Witness 09/07/2014    Patient Active Problem List  Diagnosis   Salzmann's nodular dystrophy of both eyes   Nuclear sclerosis of left eye   Nodular degeneration of cornea   Refusal of blood transfusions as patient is Jehovah's Witness   Advance directive on file   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   Dermatochalasis of upper and lower eyelids of both eyes   Intraductal carcinoma in situ of left breast    Past Surgical History:  Procedure Laterality Date   TRABECULECTOMY W/SCARRING Left 10/19/2014   Procedure: TRABECULECTOMY W/SCARRING;  Surgeon: Pratap Challa, MD;  [facility];  Service: Ophthalmology;  Laterality: Left;  PAT phone call 12.21.2015   COLONOSCOPY     CORNEAL EYE SURGERY     [facility] OS on 06/08/14   LAPAROSCOPIC TUBAL LIGATION     LENS EYE SURGERY     OD 2011     Allergies  Allergen Reactions   Iodine Shortness Of Breath   Codeine Nausea and Rash   Morphine Nausea and Rash    Current Outpatient Medications on File Prior to Visit  Medication Sig Dispense Refill   acetaminophen (TYLENOL) 500 MG tablet Take 500 mg by mouth every 6 (six) hours as needed for Pain.     albuterol 90 mcg/actuation inhaler Inhale 2 inhalations into the lungs every 6 (six) hours as needed for Wheezing.       amLODIPine (NORVASC) 10 MG tablet amlodipine 10 mg tablet     aspirin 81 MG EC tablet Take 81 mg by mouth once daily     bacitracin ophthalmic ointment Place on sutures 3-4x per day on the left upper eyeild 3.5 g 2   celecoxib (CELEBREX) 200 MG capsule Take 200 mg by mouth 2 (two) times daily  3   ciprofloxacin HCl (CILOXAN) 0.3 % ophthalmic solution Place 1 drop into the right eye 4 (four) times daily. 5 mL 0   cyanocobalamin (VITAMIN B12) 500 MCG tablet cyanocobalamin (vit B-12) 500 mcg tablet     dorzolamide-timoloL (COSOPT) 22.3-6.8 mg/mL ophthalmic solution Place 1 drop into both eyes 2 (two) times daily 30 mL 4   ergocalciferol, vitamin D2, 50,000 unit capsule Take  50,000 Units by mouth once a week. Fridays  3   folic acid (FOLVITE) 400 MCG tablet 1 tablet     FUROsemide (LASIX) 20 MG tablet furosemide 20 mg tablet     gabapentin (NEURONTIN) 100 MG capsule Take 100 mg by mouth 2 (two) times daily.     lansoprazole (PREVACID) 30 MG DR capsule Take 30 mg by mouth every morning  3   latanoprost (XALATAN) 0.005 % ophthalmic solution Place 1 drop into both eyes at bedtime 7.5 mL 4   levothyroxine (SYNTHROID) 25 MCG tablet levothyroxine 25 mcg tablet     losartan (COZAAR) 50 MG tablet      losartan-hydrochlorothiazide (HYZAAR) 50-12.5 mg tablet Take 1 tablet by mouth every morning.  0   meloxicam (MOBIC) 15 MG tablet meloxicam 15 mg tablet  TAKE 1 TABLET BY MOUTH EVERY DAY WITH FOOD     nitroGLYcerin (NITROSTAT) 0.4 MG SL tablet nitroglycerin 0.4 mg sublingual tablet     omeprazole (PRILOSEC) 20 MG DR capsule omeprazole 20 mg capsule,delayed release     potassium citrate (UROCIT-K) 10 mEq ER tablet potassium citrate ER 10 mEq (1,080 mg) tablet,extended release     prednisoLONE acetate (PRED FORTE) 1 % ophthalmic suspension Place 1 drop into the right eye 4 (four) times daily. 10 mL 1   No current facility-administered medications on file prior to visit.    Family History  Problem Relation Age of Onset   Glaucoma Mother    Blindness Mother    Cataracts Mother    Glaucoma Father    Cancer Father    Blindness Other    Glaucoma Sister    Blindness Sister    Cataracts Sister    Macular degeneration Neg Hx    Anesthesia problems Neg Hx      Social History   Tobacco Use  Smoking Status Former   Packs/day: 0.50   Years: 3.00   Additional pack years: 0.00   Total pack years: 1.50   Types: Cigarettes   Quit date: 10/14/1973   Years since quitting: 49.2  Smokeless Tobacco Never     Social History   Socioeconomic History   Marital status: Married  Tobacco Use   Smoking status: Former    Packs/day: 0.50    Years: 3.00    Additional pack  years: 0.00    Total pack years: 1.50    Types: Cigarettes    Quit date: 10/14/1973    Years since quitting: 49.2   Smokeless tobacco: Never  Substance and Sexual Activity   Alcohol use: No    Alcohol/week: 0.0 standard drinks of alcohol   Drug use: No    Objective:    Vitals:   12/23/22 1124    BP: (!) 152/80  Pulse: 80  Temp: 36.6 C (97.8 F)  SpO2: 95%    There is no height or weight on file to calculate BMI.  Physical Exam   Constitutional:  WDWN in NAD, conversant, no obvious deformities; lying in bed comfortably Eyes:  Pupils equal, round; sclera anicteric; moist conjunctiva; no lid lag HENT:  Oral mucosa moist; good dentition  Neck:  No masses palpated, trachea midline; no thyromegaly Lungs:  CTA bilaterally; normal respiratory effort Breasts:  symmetric, no nipple changes; no palpable masses or lymphadenopathy on either side; ecchymosis in the left breast from her recent biopsy CV:  Regular rate and rhythm; no murmurs; extremities well-perfused with no edema Abd:  +bowel sounds, soft, non-tender, no palpable organomegaly; no palpable hernias Musc: Normal gait; no apparent clubbing or cyanosis in extremities Lymphatic:  No palpable cervical or axillary lymphadenopathy Skin:  Warm, dry; no sign of jaundice Psychiatric - alert and oriented x 4; calm mood and affect   Labs, Imaging and Diagnostic Testing:  Diagnosis 1. Breast, left, needle core biopsy, upper central FOCAL INVASIVE MODERATELY DIFFERENTIATED DUCTAL ADENOCARCINOMA, GRADE 2 (3+2+1) EXTENSIVE DUCTAL CARCINOMA IN SITU, INTERMEDIATE NUCLEAR GRADE, SOLID AND CRIBRIFORM TYPES WITH NECROSIS TUBULE FORMATION: SCORE 3 NUCLEAR PLEOMORPHISM: SCORE 2 MITOTIC COUNT: SCORE 1 TOTAL SCORE: 6 OVERALL GRADE: GRADE 2 NEGATIVE FOR ANGIOLYMPHATIC INVASION INVASIVE TUMOR MEASURES 1.25 MM IN GREATEST LINEAR EXTENT DCIS MEASURES 4 MM IN GREATEST LINEAR EXTENT MICROCALCIFICATIONS PRESENT WITHIN DCIS, ADENOSIS AND  FIBROMATOID STROMA FOCAL FIBROMATOID CHANGE AND FIBROCYSTIC CHANGES INCLUDING STROMAL FIBROSIS, ADENOSIS AND USUAL DUCT HYPERPLASIA 2. Breast, left, needle core biopsy, retroareolar, calcifications BENIGN BREAST WITH FIBROCYSTIC CHANGES INCLUDING STROMAL FIBROSIS, CYSTIC DILATATION OF DUCTS AND USUAL DUCT HYPERPLASIA MICROCALCIFICATIONS PRESENT WITHIN BENIGN DUCTS BENIGN STROMA NEGATIVE FOR ATYPIA AND CARCINOMA Diagnosis Note 1. Immunohistochemical stains for the breast prognostic markers have been ordered, and these results will be issued within a subsequent addendum to this report. There may be insufficient invasive tumor (1C) remaining within the deeper sections for these immunohistochemical stains. Case is reviewed by Dr. Kanteti who concurs with the diagnosis. Diagnoses called to Susan at [facility] by Dr. Picklesimer on 12/09/2022 at 10:14 AM. 1 of 3 FINAL for Jagger, Jessina E ([id number]) Fred Picklesimer MD Pathologist, Electronic Signature (Case signed 12/09/2022)  ADDITIONAL INFORMATION: 1. Breast, left, needle core biopsy, upper central PROGNOSTIC INDICATORS Results: IMMUNOHISTOCHEMICAL AND MORPHOMETRIC ANALYSIS PERFORMED MANUALLY The tumor cells are positive for Her2 (3+). Estrogen Receptor: 95%, POSITIVE, STRONG STAINING INTENSITY Progesterone Receptor: 30%, POSITIVE, STRONG STAINING INTENSITY Proliferation Marker Ki67: 5% REFERENCE RANGE ESTROGEN RECEPTOR NEGATIVE 0% POSITIVE =>1% REFERENCE RANGE PROGESTERONE RECEPTOR 2 of 3 FINAL for Deangelo, Annell E ([id number]) ADDITIONAL INFORMATION:(continued) NEGATIVE 0% POSITIVE =>1% All controls stained appropriately Zhaoli Lane MD Pathologist, Electronic Signature ( Signed 12/10/2022)  CLINICAL DATA:  Left breast calcifications on a recent screening mammogram.   EXAM: DIGITAL DIAGNOSTIC UNILATERAL LEFT MAMMOGRAM WITH CAD   TECHNIQUE: Left digital diagnostic mammography was performed.    COMPARISON:  Previous exam(s).   ACR Breast Density Category b: There are scattered areas of fibroglandular density.   FINDINGS: There is a group of pleomorphic calcifications extending from the anterior to the mid to posterior left breast centered in the 12 o'clock position, spanning 5.8 x 2.9 x 2.4 cm. These are new since the patient's last mammogram dated 11/20/2018 in [place], [place].   IMPRESSION: 5.8 cm group of left breast calcifications suspicious for malignancy.   RECOMMENDATION: 3D stereotactic   guided core needle biopsy of the posterior and anterior extents of the 5.8 cm group of calcifications in the left breast. This has been discussed with the patient and scheduled at 12:45 p.m. on 07/19/2022.   I have discussed the findings and recommendations with the patient. If applicable, a reminder letter will be sent to the patient regarding the next appointment.   BI-RADS CATEGORY  4: Suspicious.     Electronically Signed   By: Steven  Reid M.D.   On: 07/05/2022 14:45    Assessment and Plan:  Diagnoses and all orders for this visit:  Malignant neoplasm of central portion of left breast in female, estrogen receptor positive   Intraductal carcinoma in situ of left breast    The patient has a large area of calcifications.  Most of this likely represents DCIS with a small area of invasive ductal carcinoma.  I had a discussion with the patient and her son regarding surgical options.  We discussed mastectomy versus breast conserving therapy.  She is not a good candidate for breast conserving therapy due to the large size of the area of calcifications.  Therefore we will proceed with a left mastectomy.  At this time, sentinel lymph node biopsy is not indicated due to her age.  The surgical procedure has been discussed with the patient.  Potential risks, benefits, alternative treatments, and expected outcomes have been explained.  All of the patient's questions at this time  have been answered.  The likelihood of reaching the patient's treatment goal is good.  The patient understand the proposed surgical procedure and wishes to proceed.  We will refer the patient to oncology for further discussion   Selby Slovacek KAI Mallarie Voorhies, MD  12/23/2022 12:10 PM 

## 2022-12-24 ENCOUNTER — Telehealth: Payer: Self-pay | Admitting: Hematology and Oncology

## 2022-12-24 NOTE — Telephone Encounter (Signed)
scheduled per 3/11 referral, pt has been called and confirmed date and time. Pt is aware of location and to arrive early for check in

## 2022-12-30 ENCOUNTER — Encounter: Payer: Self-pay | Admitting: Podiatry

## 2022-12-30 ENCOUNTER — Other Ambulatory Visit: Payer: Self-pay | Admitting: Podiatry

## 2022-12-30 DIAGNOSIS — M21542 Acquired clubfoot, left foot: Secondary | ICD-10-CM | POA: Diagnosis not present

## 2022-12-30 DIAGNOSIS — M216X2 Other acquired deformities of left foot: Secondary | ICD-10-CM | POA: Diagnosis not present

## 2022-12-30 HISTORY — PX: METATARSAL OSTEOTOMY: SHX1641

## 2022-12-30 MED ORDER — OXYCODONE-ACETAMINOPHEN 5-325 MG PO TABS: 1.0000 | ORAL_TABLET | ORAL | 0 refills | Status: AC | PRN

## 2022-12-30 MED ORDER — IBUPROFEN 800 MG PO TABS: 800.0000 mg | ORAL_TABLET | Freq: Four times a day (QID) | ORAL | 1 refills | Status: DC | PRN

## 2023-01-01 DIAGNOSIS — I10 Essential (primary) hypertension: Secondary | ICD-10-CM | POA: Diagnosis not present

## 2023-01-02 DIAGNOSIS — I444 Left anterior fascicular block: Secondary | ICD-10-CM | POA: Diagnosis not present

## 2023-01-02 DIAGNOSIS — H903 Sensorineural hearing loss, bilateral: Secondary | ICD-10-CM | POA: Diagnosis not present

## 2023-01-02 DIAGNOSIS — H6123 Impacted cerumen, bilateral: Secondary | ICD-10-CM | POA: Diagnosis not present

## 2023-01-03 DIAGNOSIS — K5909 Other constipation: Secondary | ICD-10-CM | POA: Diagnosis not present

## 2023-01-03 DIAGNOSIS — K644 Residual hemorrhoidal skin tags: Secondary | ICD-10-CM | POA: Diagnosis not present

## 2023-01-03 DIAGNOSIS — R109 Unspecified abdominal pain: Secondary | ICD-10-CM | POA: Diagnosis not present

## 2023-01-03 DIAGNOSIS — K6289 Other specified diseases of anus and rectum: Secondary | ICD-10-CM | POA: Diagnosis not present

## 2023-01-03 DIAGNOSIS — R1030 Lower abdominal pain, unspecified: Secondary | ICD-10-CM | POA: Diagnosis not present

## 2023-01-03 NOTE — Pre-Procedure Instructions (Signed)
Surgical Instructions    Your procedure is scheduled on January 08, 2023.  Report to [facility] Main Entrance "A" at 6:30 A.M., then check in with the Admitting office.  Call this number if you have problems the morning of surgery:  [phone]  If you have any questions prior to your surgery date call [phone]: Open Monday-Friday 8am-4pm If you experience any cold or flu symptoms such as cough, fever, chills, shortness of breath, etc. between now and your scheduled surgery, please notify us at the above number.     Remember:  Do not eat after midnight the night before your surgery  You may drink clear liquids until 5:30 AM the morning of your surgery.   Clear liquids allowed are: Water, Non-Citrus Juices (without pulp), Carbonated Beverages, Clear Tea, Black Coffee Only (NO MILK, CREAM OR POWDERED CREAMER of any kind), and Gatorade.   Patient Instructions  The night before surgery:  No food after midnight. ONLY clear liquids after midnight  The day of surgery (if you do NOT have diabetes):  Drink ONE (1) Pre-Surgery Clear Ensure by 5:30 AM the morning of surgery. Drink in one sitting. Do not sip.  This drink was given to you during your hospital  pre-op appointment visit.  Nothing else to drink after completing the  Pre-Surgery Clear Ensure.         If you have questions, please contact your surgeon's office.     Take these medicines the morning of surgery with A SIP OF WATER:  amLODipine (NORVASC)   dorzolamide-timolol (COSOPT) ophthalmic solution   levothyroxine (SYNTHROID)   omeprazole (PRILOSEC)   triamcinolone (NASACORT) nasal inhaler     May take these medicines IF NEEDED:  albuterol (VENTOLIN HFA) inhaler   gabapentin (NEURONTIN)   levocetirizine (XYZAL)   nitroGLYCERIN (NITROSTAT)   oxyCODONE-acetaminophen (PERCOCET)     Follow your surgeon's instructions on when to stop Aspirin.  If no instructions were given by your surgeon then you will need to call  the office to get those instructions.     As of today, STOP taking any Aleve, Naproxen, Ibuprofen, Motrin, Advil, Goody's, BC's, all herbal medications, fish oil, and all vitamins. This includes your medication: meloxicam (MOBIC).                     Do NOT Smoke (Tobacco/Vaping) for 24 hours prior to your procedure.  If you use a CPAP at night, you may bring your mask/headgear for your overnight stay.   Contacts, glasses, piercing's, hearing aid's, dentures or partials may not be worn into surgery, please bring cases for these belongings.    For patients admitted to the hospital, discharge time will be determined by your treatment team.   Patients discharged the day of surgery will not be allowed to drive home, and someone needs to stay with them for 24 hours.  SURGICAL WAITING ROOM VISITATION Patients having surgery or a procedure may have no more than 2 support people in the waiting area - these visitors may rotate.   Children under the age of 54 must have an adult with them who is not the patient. If the patient needs to stay at the hospital during part of their recovery, the visitor guidelines for inpatient rooms apply. Pre-op nurse will coordinate an appropriate time for 1 support person to accompany patient in pre-op.  This support person may not rotate.   Please refer to the [facility] website for the visitor guidelines for Inpatients (after your  surgery is over and you are in a regular room).    Special instructions:   [facility]- Preparing For Surgery  Before surgery, you can play an important role. Because skin is not sterile, your skin needs to be as free of germs as possible. You can reduce the number of germs on your skin by washing with CHG (chlorahexidine gluconate) Soap before surgery.  CHG is an antiseptic cleaner which kills germs and bonds with the skin to continue killing germs even after washing.    Oral Hygiene is also important to reduce your risk of infection.   Remember - BRUSH YOUR TEETH THE MORNING OF SURGERY WITH YOUR REGULAR TOOTHPASTE  Please do not use if you have an allergy to CHG or antibacterial soaps. If your skin becomes reddened/irritated stop using the CHG.  Do not shave (including legs and underarms) for at least 48 hours prior to first CHG shower. It is OK to shave your face.  Please follow these instructions carefully.   Shower the NIGHT BEFORE SURGERY and the MORNING OF SURGERY  If you chose to wash your hair, wash your hair first as usual with your normal shampoo.  After you shampoo, rinse your hair and body thoroughly to remove the shampoo.  Use CHG Soap as you would any other liquid soap. You can apply CHG directly to the skin and wash gently with a scrungie or a clean washcloth.   Apply the CHG Soap to your body ONLY FROM THE NECK DOWN.  Do not use on open wounds or open sores. Avoid contact with your eyes, ears, mouth and genitals (private parts). Wash Face and genitals (private parts)  with your normal soap.   Wash thoroughly, paying special attention to the area where your surgery will be performed.  Thoroughly rinse your body with warm water from the neck down.  DO NOT shower/wash with your normal soap after using and rinsing off the CHG Soap.  Pat yourself dry with a CLEAN TOWEL.  Wear CLEAN PAJAMAS to bed the night before surgery  Place CLEAN SHEETS on your bed the night before your surgery  DO NOT SLEEP WITH PETS.   Day of Surgery: Take a shower with CHG soap. Do not wear jewelry or makeup Do not wear lotions, powders, perfumes/colognes, or deodorant. Do not shave 48 hours prior to surgery.  Men may shave face and neck. Do not bring valuables to the hospital.  [facility] is not responsible for any belongings or valuables. Do not wear nail polish, gel polish, artificial nails, or any other type of covering on natural nails (fingers and toes) If you have artificial nails or gel coating that need to be  removed by a nail salon, please have this removed prior to surgery. Artificial nails or gel coating may interfere with anesthesia's ability to adequately monitor your vital signs.  Wear Clean/Comfortable clothing the morning of surgery Remember to brush your teeth WITH YOUR REGULAR TOOTHPASTE.   Please read over the following fact sheets that you were given.    If you received a COVID test during your pre-op visit  it is requested that you wear a mask when out in public, stay away from anyone that may not be feeling well and notify your surgeon if you develop symptoms. If you have been in contact with anyone that has tested positive in the last 10 days please notify you surgeon.

## 2023-01-06 ENCOUNTER — Other Ambulatory Visit: Payer: Self-pay

## 2023-01-06 ENCOUNTER — Encounter (HOSPITAL_COMMUNITY): Payer: Self-pay

## 2023-01-06 ENCOUNTER — Encounter (HOSPITAL_COMMUNITY)
Admission: RE | Admit: 2023-01-06 | Discharge: 2023-01-06 | Disposition: A | Discharge status: Home / Self Care | Payer: Medicare HMO | Source: Ambulatory Visit | Attending: Surgery | Admitting: Surgery

## 2023-01-06 VITALS — BP 146/91 | HR 59 | Temp 98.3°F | Resp 18 | Ht 61.0 in | Wt 123.6 lb

## 2023-01-06 DIAGNOSIS — J45909 Unspecified asthma, uncomplicated: Secondary | ICD-10-CM | POA: Diagnosis not present

## 2023-01-06 DIAGNOSIS — M1990 Unspecified osteoarthritis, unspecified site: Secondary | ICD-10-CM | POA: Diagnosis not present

## 2023-01-06 DIAGNOSIS — E039 Hypothyroidism, unspecified: Secondary | ICD-10-CM | POA: Diagnosis not present

## 2023-01-06 DIAGNOSIS — Z01812 Encounter for preprocedural laboratory examination: Secondary | ICD-10-CM | POA: Diagnosis not present

## 2023-01-06 DIAGNOSIS — K219 Gastro-esophageal reflux disease without esophagitis: Secondary | ICD-10-CM | POA: Insufficient documentation

## 2023-01-06 DIAGNOSIS — I10 Essential (primary) hypertension: Secondary | ICD-10-CM | POA: Insufficient documentation

## 2023-01-06 DIAGNOSIS — Z01818 Encounter for other preprocedural examination: Secondary | ICD-10-CM

## 2023-01-06 DIAGNOSIS — D0512 Intraductal carcinoma in situ of left breast: Secondary | ICD-10-CM | POA: Diagnosis not present

## 2023-01-06 DIAGNOSIS — M797 Fibromyalgia: Secondary | ICD-10-CM | POA: Insufficient documentation

## 2023-01-06 HISTORY — DX: Hypothyroidism, unspecified: E03.9

## 2023-01-06 LAB — CBC
HCT: 41.1 % (ref 36.0–46.0)
Hemoglobin: 14.1 g/dL (ref 12.0–15.0)
MCH: 31.3 pg (ref 26.0–34.0)
MCHC: 34.3 g/dL (ref 30.0–36.0)
MCV: 91.3 fL (ref 80.0–100.0)
Platelets: 225 10*3/uL (ref 150–400)
RBC: 4.5 MIL/uL (ref 3.87–5.11)
RDW: 13.6 % (ref 11.5–15.5)
WBC: 8.6 10*3/uL (ref 4.0–10.5)
nRBC: 0 % (ref 0.0–0.2)

## 2023-01-06 LAB — BASIC METABOLIC PANEL
Anion gap: 10 (ref 5–15)
BUN: 19 mg/dL (ref 8–23)
CO2: 22 mmol/L (ref 22–32)
Calcium: 9.3 mg/dL (ref 8.9–10.3)
Chloride: 105 mmol/L (ref 98–111)
Creatinine, Ser: 1.02 mg/dL — ABNORMAL HIGH (ref 0.44–1.00)
GFR, Estimated: 55 mL/min — ABNORMAL LOW (ref >60)
Glucose, Bld: 178 mg/dL — ABNORMAL HIGH (ref 70–99)
Potassium: 3.7 mmol/L (ref 3.5–5.1)
Sodium: 137 mmol/L (ref 135–145)

## 2023-01-06 LAB — NO BLOOD PRODUCTS

## 2023-01-06 NOTE — Progress Notes (Signed)
PCP - Dr. Early Osmond Cardiologist - Dr. Milana Huntsman   PPM/ICD - n/a  Chest x-ray - n/a EKG - 01/01/23-requested  Stress Test - 08/21/21-[facility] ECHO - 08/15/21-[facility] Cardiac Cath - denies  Sleep Study - denies CPAP - denies  Blood Thinner Instructions: n/a Aspirin Instructions: Has held ASA over a week.   ERAS Protcol -Clear liquids until 0530 DOS PRE-SURGERY Ensure or G2- Ensure provided.  COVID TEST- n/a  Anesthesia review: Yes, heart hx. Previous note for cardiology in [facility] on 01/01/23. Clearance?  Patient denies shortness of breath, fever, cough and chest pain at PAT appointment   All instructions explained to the patient, with a verbal understanding of the material. Patient agrees to go over the instructions while at home for a better understanding. Patient also instructed to self quarantine after being tested for COVID-19. The opportunity to ask questions was provided.

## 2023-01-07 ENCOUNTER — Ambulatory Visit (INDEPENDENT_AMBULATORY_CARE_PROVIDER_SITE_OTHER): Payer: Medicare HMO

## 2023-01-07 ENCOUNTER — Ambulatory Visit (INDEPENDENT_AMBULATORY_CARE_PROVIDER_SITE_OTHER): Payer: Medicare HMO | Admitting: Podiatry

## 2023-01-07 ENCOUNTER — Encounter: Payer: Self-pay | Admitting: Podiatry

## 2023-01-07 VITALS — BP 141/51 | HR 55

## 2023-01-07 DIAGNOSIS — M79672 Pain in left foot: Secondary | ICD-10-CM

## 2023-01-07 DIAGNOSIS — Z9889 Other specified postprocedural states: Secondary | ICD-10-CM

## 2023-01-07 DIAGNOSIS — M216X2 Other acquired deformities of left foot: Secondary | ICD-10-CM

## 2023-01-07 NOTE — Progress Notes (Signed)
Anesthesia Chart Review:  Case: [id number] Date/Time: 01/08/23 0815   Procedure: LEFT TOTAL MASTECTOMY (Left: Breast)   Anesthesia type: General   Pre-op diagnosis: LEFT BREAST INVASIVE DUCTAL CARCINOMA/DUCTAL CARCINOMA IN SITU   Location: [place] OR ROOM 06 / [place] [facility]   Surgeons: Donnie Mesa, MD       DISCUSSION: Patient is an 82 year old female scheduled for the above procedure.  History includes never smoker, HTN, asthma, GERD, hypothyroidism, fibromyalgia, osteoarthritis, left breast cancer (12/07/21). Mild MR/TR by 08/15/21 echo.   Her last cardiology evaluation was on 01/01/23 by Dr. Clydie Braun with [facility]. There was question of possible afib after foot surgery, however; he felt there were some discernable P waves. 11/2022 event monitor did not show afib, but a previous monitor showed brief potentially atrial tachycardia or a-flutter. No change in therapy made, but will consider repeat monitor in the future. From a cardiac standpoint, she had not had any chest pain, orthopnea, PND, syncope, or palpitations. Non-ischemic stress test in 08/2021. 08/2021 echo showed normal LVEF 0000000, grade 1 diastolic dysfunction, mild MR/TR. He noted plans for breast surgery. Three month follow-up planned.   CBC normal. She signed a refusal of blood or blood products form.   Anesthesia team to evaluate on the day of surgery. 01/01/23 EKG requested from [facility], but is still pending.    VS: BP (!) 146/91   Pulse (!) 59   Temp 36.8 C (Oral)   Resp 18   Ht 5\' 1"  (1.549 m)   Wt 56.1 kg   SpO2 99%   BMI 23.35 kg/m    PROVIDERS: Pahwani, Michell Heinrich, MD is PCP  Milana Huntsman, MD is cardiologist ([facility])   LABS: Labs reviewed: Acceptable for surgery. (all labs ordered are listed, but only abnormal results are displayed)  Labs Reviewed  BASIC METABOLIC PANEL - Abnormal; Notable for the following components:      Result Value   Glucose, Bld 178 (*)    Creatinine, Ser 1.02 (*)     GFR, Estimated 55 (*)    All other components within normal limits  CBC  NO BLOOD PRODUCTS    EKG: 01/01/23 [facility]):  Sinus bradycardia  Left anterior fascicular block  LVH with secondary repolarization abnormality  NO PRIOR EKGs FOR COMPARISON  Confirmed by Follett, Dona Ana  on 01-02-2023 12 02 18 PM    CV: Cardiac heart monitoring 11/15/22 ([facility]): Per 01/01/23 cardiology office note by Dr. Clydie Braun, "she just wore a monitor that showed no atrial fibrillation. She did however have a prior monitor that showed potentially atrial tachycardia versus atrial flutter. It was brief. At this time I will continue just her current therapy we will consider having a repeat monitor at her next follow-up visit."   Nuclear stress test 08/21/21 ([facility]): IMPRESSION:  Fixed Apical wall defect without associated wall motion abnormality  compatible with tissue attenuation.   No significant ischemia is suggested  Normal left ventricular function with ejection fraction calculated at 62 %    Echo 08/15/21 ([facility]): SUMMARY  Recommend follow up transthoracic echocardiogram in every 3-5 years  for routine surveillance of mild valvular heart disease. Change in  cardiac exam or clinical status may warrant earlier reassessment. 2020  ACC-AHA Guideline  Circ. vol 143, e72-e227.   Normal LV size, wall thickness, wall motion and systolic function with  ejection fraction 55-60%  There is mild, Grade I diastolic dysfuunction, with indeterminate left  atrial pressure. Left atrial pressure  is indeterminate due to  E-e  > 8 and < 14  The right ventricle is normal in size and function.  The atria are mildly dilated.  There is mild mitral regurgitation.  There is mild tricuspid regurgitation.  No pulmonary hypertension.  The IVC is normal in size with an inspiratory collapse of greater then  50%, suggesting normal right atrial pressure.  There is no comparison study available.    Past Medical  History:  Diagnosis Date   Asthma    Benign essential hypertension    Esophageal reflux    Fibromyalgia    Hypothyroidism    Osteoarthritis    Osteopenia    Vitamin D deficiency     Past Surgical History:  Procedure Laterality Date   APPENDECTOMY     BREAST BIOPSY Left 12/06/2022   MM LT BREAST BX W LOC DEV EA AD LESION IMG BX SPEC STEREO GUIDE 12/06/2022 [facility]   BREAST BIOPSY Left 12/06/2022   MM LT BREAST BX W LOC DEV 1ST LESION IMAGE BX SPEC STEREO GUIDE 12/06/2022 [facility]   EYE SURGERY     METATARSAL OSTEOTOMY Left 12/30/2022   4TH LEFT   TUBAL LIGATION      MEDICATIONS:  albuterol (VENTOLIN HFA) 108 (90 Base) MCG/ACT inhaler   amLODipine (NORVASC) 10 MG tablet   aspirin EC 81 MG tablet   clotrimazole-betamethasone (LOTRISONE) cream   dorzolamide-timolol (COSOPT) 2-0.5 % ophthalmic solution   furosemide (LASIX) 20 MG tablet   gabapentin (NEURONTIN) 100 MG capsule   ibuprofen (ADVIL) 800 MG tablet   latanoprost (XALATAN) 0.005 % ophthalmic solution   levocetirizine (XYZAL) 5 MG tablet   levothyroxine (SYNTHROID) 25 MCG tablet   levothyroxine (SYNTHROID) 50 MCG tablet   losartan (COZAAR) 50 MG tablet   meloxicam (MOBIC) 15 MG tablet   nitroGLYCERIN (NITROSTAT) 0.4 MG SL tablet   omeprazole (PRILOSEC) 20 MG capsule   oxyCODONE-acetaminophen (PERCOCET) 5-325 MG tablet   potassium citrate (UROCIT-K) 10 MEQ (1080 MG) SR tablet   triamcinolone (NASACORT) 55 MCG/ACT AERO nasal inhaler   vitamin B-12 (CYANOCOBALAMIN) 500 MCG tablet   Vitamin D, Ergocalciferol, (DRISDOL) 1.25 MG (50000 UNIT) CAPS capsule   No current facility-administered medications for this encounter.    Myra Gianotti, PA-C Surgical Short Stay/Anesthesiology [facility] Phone [phone] [facility] Phone [phone] 01/07/2023 11:19 AM

## 2023-01-07 NOTE — Progress Notes (Signed)
Subjective:  Patient ID: Jessica Ayers, female    DOB: 03-18-1941,  MRN: [id number]  Chief Complaint  Patient presents with   Routine Post Op    "It's fine, it didn't hurt at all."    DOS: 12/30/2022 Procedure: Left floating osteotomy  82 y.o. female returns for post-op check.  Patient states she is doing well.  Minimal complaints.  Pain manageable.  Bandages clean dry and intact ambulating with a surgical shoe  Review of Systems: Negative except as noted in the HPI. Denies N/V/F/Ch.  Past Medical History:  Diagnosis Date   Asthma    Benign essential hypertension    Esophageal reflux    Fibromyalgia    Hypothyroidism    Osteoarthritis    Osteopenia    Vitamin D deficiency     Current Outpatient Medications:    albuterol (VENTOLIN HFA) 108 (90 Base) MCG/ACT inhaler, Inhale 1-2 puffs into the lungs every 4 (four) hours as needed for wheezing., Disp: , Rfl:    amLODipine (NORVASC) 10 MG tablet, Take 10 mg by mouth daily., Disp: , Rfl:    aspirin EC 81 MG tablet, Take 81 mg by mouth daily. Swallow whole., Disp: , Rfl:    clotrimazole-betamethasone (LOTRISONE) cream, APPLY TO AFFECTED AREA TWICE DAILY (Patient taking differently: Apply 1 Application topically 2 (two) times daily.), Disp: 90 g, Rfl: 3   dorzolamide-timolol (COSOPT) 2-0.5 % ophthalmic solution, Place 1 drop into both eyes 2 (two) times daily., Disp: , Rfl:    furosemide (LASIX) 20 MG tablet, Take 20 mg by mouth daily as needed for edema., Disp: , Rfl:    gabapentin (NEURONTIN) 100 MG capsule, Take 100 mg by mouth 3 (three) times daily as needed (pain)., Disp: , Rfl:    ibuprofen (ADVIL) 800 MG tablet, Take 1 tablet (800 mg total) by mouth every 6 (six) hours as needed. (Patient taking differently: Take 400 mg by mouth every 6 (six) hours as needed.), Disp: 60 tablet, Rfl: 1   latanoprost (XALATAN) 0.005 % ophthalmic solution, Place 1 drop into both eyes at bedtime., Disp: , Rfl:    levocetirizine (XYZAL) 5 MG tablet,  TAKE 1 TABLET (5 MG TOTAL) BY MOUTH 2 (TWO) TIMES DAILY AS NEEDED FOR ALLERGIES (FOR BREAKTHROUGH SYMPTOMS)., Disp: 60 tablet, Rfl: 1   levothyroxine (SYNTHROID) 25 MCG tablet, Take 25 mcg by mouth daily before breakfast., Disp: , Rfl:    levothyroxine (SYNTHROID) 50 MCG tablet, Take 50 mcg by mouth daily before breakfast., Disp: , Rfl:    losartan (COZAAR) 50 MG tablet, Take 50 mg by mouth daily., Disp: , Rfl:    meloxicam (MOBIC) 15 MG tablet, Take 15 mg by mouth daily as needed for pain., Disp: , Rfl:    nitroGLYCERIN (NITROSTAT) 0.4 MG SL tablet, Place 0.4 mg under the tongue every 5 (five) minutes as needed for chest pain., Disp: , Rfl:    omeprazole (PRILOSEC) 20 MG capsule, Take 20 mg by mouth daily., Disp: , Rfl:    oxyCODONE-acetaminophen (PERCOCET) 5-325 MG tablet, Take 1 tablet by mouth every 4 (four) hours as needed for severe pain. (Patient taking differently: Take 0.5 tablets by mouth every 4 (four) hours as needed for severe pain.), Disp: 30 tablet, Rfl: 0   potassium citrate (UROCIT-K) 10 MEQ (1080 MG) SR tablet, Take 10 mEq by mouth daily., Disp: , Rfl:    triamcinolone (NASACORT) 55 MCG/ACT AERO nasal inhaler, Place 2 sprays into the nose daily., Disp: 16.5 g, Rfl: 2   vitamin  B-12 (CYANOCOBALAMIN) 500 MCG tablet, Take 500 mcg by mouth daily., Disp: , Rfl:    Vitamin D, Ergocalciferol, (DRISDOL) 1.25 MG (50000 UNIT) CAPS capsule, Take 50,000 Units by mouth every Friday., Disp: , Rfl:   Social History   Tobacco Use  Smoking Status Never  Smokeless Tobacco Never    Allergies  Allergen Reactions   Cyclobenzaprine     Unknown reaction   Lyrica [Pregabalin]     Unknown reaction   Prednisone     Unknown reaction    Codeine Itching, Nausea And Vomiting and Rash   Morphine And Related Itching, Nausea And Vomiting and Rash   Objective:   Vitals:   01/07/23 1605  BP: (!) 141/51  Pulse: (!) 55   There is no height or weight on file to calculate BMI. Constitutional Well  developed. Well nourished.  Vascular Foot warm and well perfused. Capillary refill normal to all digits.   Neurologic Normal speech. Oriented to person, place, and time. Epicritic sensation to light touch grossly present bilaterally.  Dermatologic Skin healing well without signs of infection. Skin edges well coapted without signs of infection.  Orthopedic: Tenderness to palpation noted about the surgical site.   Radiographs: 3 views of skeletally mature adult left foot: Good correction alignment noted osteotomy of the fourth metatarsal noted. Assessment:   1. Status post surgery    Plan:  Patient was evaluated and treated and all questions answered.  S/p foot surgery left -Progressing as expected post-operatively. -XR: See above -WB Status: Weightbearing as tolerated in surgical shoe -Sutures: Intact.  No clinical signs of Deis is noted no complication noted. -Medications: None -Foot redressed.  No follow-ups on file. Healing fine

## 2023-01-07 NOTE — Anesthesia Preprocedure Evaluation (Addendum)
Anesthesia Evaluation  Patient identified by MRN, date of birth, ID band Patient awake    Reviewed: Allergy & Precautions, NPO status , Patient's Chart, lab work & pertinent test results  History of Anesthesia Complications Negative for: history of anesthetic complications  Airway Mallampati: II  TM Distance: >3 FB Neck ROM: Full    Dental no notable dental hx.    Pulmonary asthma    Pulmonary exam normal        Cardiovascular hypertension, Pt. on medications Normal cardiovascular exam     Neuro/Psych    GI/Hepatic Neg liver ROS,GERD  Medicated,,  Endo/Other  Hypothyroidism    Renal/GU negative Renal ROS     Musculoskeletal  (+) Arthritis ,  Fibromyalgia -  Abdominal   Peds  Hematology  (+) REFUSES BLOOD PRODUCTS, JEHOVAH'S WITNESS  Anesthesia Other Findings Left breast ca  Reproductive/Obstetrics                              Anesthesia Physical Anesthesia Plan  ASA: 2  Anesthesia Plan: General   Post-op Pain Management: Tylenol PO (pre-op)* and Regional block*   Induction: Intravenous  PONV Risk Score and Plan: 3 and Treatment may vary due to age or medical condition, Dexamethasone and Ondansetron  Airway Management Planned: Oral ETT  Additional Equipment: None  Intra-op Plan:   Post-operative Plan: Extubation in OR  Informed Consent: I have reviewed the patients History and Physical, chart, labs and discussed the procedure including the risks, benefits and alternatives for the proposed anesthesia with the patient or authorized representative who has indicated his/her understanding and acceptance.     Dental advisory given  Plan Discussed with: CRNA  Anesthesia Plan Comments: (PAT note written 01/07/2023 by Myra Gianotti, PA-C.  )        Anesthesia Quick Evaluation

## 2023-01-07 NOTE — Progress Notes (Signed)
Spoke with pt to arrive at 0700 tom.  To stop clear liquids at 0600.

## 2023-01-08 ENCOUNTER — Observation Stay (HOSPITAL_COMMUNITY)
Admission: RE | Admit: 2023-01-08 | Discharge: 2023-01-09 | Disposition: A | Discharge status: Home / Self Care | Payer: Medicare HMO | Attending: Surgery | Admitting: Surgery

## 2023-01-08 ENCOUNTER — Ambulatory Visit (HOSPITAL_BASED_OUTPATIENT_CLINIC_OR_DEPARTMENT_OTHER): Payer: Medicare HMO | Admitting: Certified Registered"

## 2023-01-08 ENCOUNTER — Encounter (HOSPITAL_COMMUNITY): Payer: Self-pay | Admitting: Surgery

## 2023-01-08 ENCOUNTER — Encounter (HOSPITAL_COMMUNITY)
Admission: RE | Disposition: A | Discharge status: Home / Self Care | Payer: Self-pay | Source: Home / Self Care | Attending: Surgery

## 2023-01-08 ENCOUNTER — Other Ambulatory Visit: Payer: Self-pay

## 2023-01-08 ENCOUNTER — Encounter: Payer: Medicare HMO | Admitting: Podiatry

## 2023-01-08 ENCOUNTER — Ambulatory Visit (HOSPITAL_COMMUNITY): Payer: Medicare HMO | Admitting: Vascular Surgery

## 2023-01-08 DIAGNOSIS — Z87891 Personal history of nicotine dependence: Secondary | ICD-10-CM | POA: Insufficient documentation

## 2023-01-08 DIAGNOSIS — I10 Essential (primary) hypertension: Secondary | ICD-10-CM | POA: Diagnosis not present

## 2023-01-08 DIAGNOSIS — C50112 Malignant neoplasm of central portion of left female breast: Principal | ICD-10-CM | POA: Insufficient documentation

## 2023-01-08 DIAGNOSIS — J45909 Unspecified asthma, uncomplicated: Secondary | ICD-10-CM | POA: Insufficient documentation

## 2023-01-08 DIAGNOSIS — C50912 Malignant neoplasm of unspecified site of left female breast: Secondary | ICD-10-CM

## 2023-01-08 DIAGNOSIS — Z7982 Long term (current) use of aspirin: Secondary | ICD-10-CM | POA: Insufficient documentation

## 2023-01-08 DIAGNOSIS — D0512 Intraductal carcinoma in situ of left breast: Secondary | ICD-10-CM | POA: Diagnosis not present

## 2023-01-08 DIAGNOSIS — Z79899 Other long term (current) drug therapy: Secondary | ICD-10-CM | POA: Diagnosis not present

## 2023-01-08 DIAGNOSIS — G8918 Other acute postprocedural pain: Secondary | ICD-10-CM | POA: Diagnosis not present

## 2023-01-08 DIAGNOSIS — Z170 Estrogen receptor positive status [ER+]: Secondary | ICD-10-CM | POA: Insufficient documentation

## 2023-01-08 DIAGNOSIS — N641 Fat necrosis of breast: Secondary | ICD-10-CM | POA: Diagnosis not present

## 2023-01-08 HISTORY — PX: TOTAL MASTECTOMY: SHX6129

## 2023-01-08 SURGERY — MASTECTOMY, SIMPLE
Anesthesia: General | Site: Breast | Laterality: Left

## 2023-01-08 MED ORDER — LOSARTAN POTASSIUM 50 MG PO TABS
50.0000 mg | ORAL_TABLET | Freq: Every day | ORAL | Status: DC
  Administered 2023-01-08 – 2023-01-09 (×2): 50 mg via ORAL
  Filled 2023-01-08 (×2): qty 1

## 2023-01-08 MED ORDER — BUPIVACAINE LIPOSOME 1.3 % IJ SUSP
INTRAMUSCULAR | Status: DC | PRN
  Administered 2023-01-08: 10 mL via PERINEURAL

## 2023-01-08 MED ORDER — FENTANYL CITRATE (PF) 100 MCG/2ML IJ SOLN
INTRAMUSCULAR | Status: AC
  Filled 2023-01-08: qty 2

## 2023-01-08 MED ORDER — PHENYLEPHRINE HCL-NACL 20-0.9 MG/250ML-% IV SOLN
INTRAVENOUS | Status: DC | PRN
  Administered 2023-01-08: 50 ug/min via INTRAVENOUS

## 2023-01-08 MED ORDER — CEFAZOLIN SODIUM-DEXTROSE 2-4 GM/100ML-% IV SOLN
2.0000 g | INTRAVENOUS | Status: AC
  Administered 2023-01-08: 2 g via INTRAVENOUS
  Filled 2023-01-08: qty 100

## 2023-01-08 MED ORDER — DORZOLAMIDE HCL-TIMOLOL MAL 2-0.5 % OP SOLN
1.0000 [drp] | Freq: Two times a day (BID) | OPHTHALMIC | Status: DC
  Administered 2023-01-08 – 2023-01-09 (×2): 1 [drp] via OPHTHALMIC
  Filled 2023-01-08: qty 10

## 2023-01-08 MED ORDER — MIDAZOLAM HCL 2 MG/2ML IJ SOLN
INTRAMUSCULAR | Status: AC
  Filled 2023-01-08: qty 2

## 2023-01-08 MED ORDER — SUGAMMADEX SODIUM 200 MG/2ML IV SOLN
INTRAVENOUS | Status: DC | PRN
  Administered 2023-01-08: 120 mg via INTRAVENOUS

## 2023-01-08 MED ORDER — ACETAMINOPHEN 500 MG PO TABS: 1000.0000 mg | ORAL_TABLET | ORAL | Status: DC

## 2023-01-08 MED ORDER — ONDANSETRON HCL 4 MG/2ML IJ SOLN
INTRAMUSCULAR | Status: DC | PRN
  Administered 2023-01-08: 4 mg via INTRAVENOUS

## 2023-01-08 MED ORDER — ROCURONIUM BROMIDE 10 MG/ML (PF) SYRINGE
PREFILLED_SYRINGE | INTRAVENOUS | Status: AC
  Filled 2023-01-08: qty 10

## 2023-01-08 MED ORDER — TRAMADOL HCL 50 MG PO TABS: 50.0000 mg | ORAL_TABLET | Freq: Four times a day (QID) | ORAL | Status: DC | PRN

## 2023-01-08 MED ORDER — DOCUSATE SODIUM 100 MG PO CAPS
100.0000 mg | ORAL_CAPSULE | Freq: Two times a day (BID) | ORAL | Status: DC
  Filled 2023-01-08 (×2): qty 1

## 2023-01-08 MED ORDER — DEXAMETHASONE SODIUM PHOSPHATE 10 MG/ML IJ SOLN
INTRAMUSCULAR | Status: AC
  Filled 2023-01-08: qty 1

## 2023-01-08 MED ORDER — DIPHENHYDRAMINE HCL 50 MG/ML IJ SOLN: 12.5000 mg | Freq: Four times a day (QID) | INTRAMUSCULAR | Status: DC | PRN

## 2023-01-08 MED ORDER — TRIAMCINOLONE ACETONIDE 55 MCG/ACT NA AERO: 2.0000 | INHALATION_SPRAY | Freq: Every day | NASAL | Status: DC

## 2023-01-08 MED ORDER — FENTANYL CITRATE (PF) 250 MCG/5ML IJ SOLN
INTRAMUSCULAR | Status: AC
  Filled 2023-01-08: qty 5

## 2023-01-08 MED ORDER — PROPOFOL 10 MG/ML IV BOLUS
INTRAVENOUS | Status: AC
  Filled 2023-01-08: qty 20

## 2023-01-08 MED ORDER — LIDOCAINE 2% (20 MG/ML) 5 ML SYRINGE
INTRAMUSCULAR | Status: AC
  Filled 2023-01-08: qty 5

## 2023-01-08 MED ORDER — PROPOFOL 10 MG/ML IV BOLUS
INTRAVENOUS | Status: DC | PRN
  Administered 2023-01-08: 120 mg via INTRAVENOUS

## 2023-01-08 MED ORDER — ROCURONIUM BROMIDE 10 MG/ML (PF) SYRINGE
PREFILLED_SYRINGE | INTRAVENOUS | Status: DC | PRN
  Administered 2023-01-08: 50 mg via INTRAVENOUS

## 2023-01-08 MED ORDER — EPHEDRINE 5 MG/ML INJ
INTRAVENOUS | Status: AC
  Filled 2023-01-08: qty 10

## 2023-01-08 MED ORDER — EPHEDRINE SULFATE-NACL 50-0.9 MG/10ML-% IV SOSY
PREFILLED_SYRINGE | INTRAVENOUS | Status: DC | PRN
  Administered 2023-01-08: 5 mg via INTRAVENOUS

## 2023-01-08 MED ORDER — FENTANYL CITRATE (PF) 250 MCG/5ML IJ SOLN
INTRAMUSCULAR | Status: DC | PRN
  Administered 2023-01-08: 50 ug via INTRAVENOUS

## 2023-01-08 MED ORDER — DEXAMETHASONE SODIUM PHOSPHATE 10 MG/ML IJ SOLN
INTRAMUSCULAR | Status: DC | PRN
  Administered 2023-01-08: 4 mg via INTRAVENOUS

## 2023-01-08 MED ORDER — GLYCOPYRROLATE 0.2 MG/ML IJ SOLN
INTRAMUSCULAR | Status: DC | PRN
  Administered 2023-01-08: .2 mg via INTRAVENOUS

## 2023-01-08 MED ORDER — ONDANSETRON HCL 4 MG/2ML IJ SOLN: 4.0000 mg | Freq: Four times a day (QID) | INTRAMUSCULAR | Status: DC | PRN

## 2023-01-08 MED ORDER — ALBUTEROL SULFATE HFA 108 (90 BASE) MCG/ACT IN AERS: 1.0000 | INHALATION_SPRAY | RESPIRATORY_TRACT | Status: DC | PRN

## 2023-01-08 MED ORDER — HEMOSTATIC AGENTS (NO CHARGE) OPTIME
TOPICAL | Status: DC | PRN
  Administered 2023-01-08 (×2): 1 via TOPICAL

## 2023-01-08 MED ORDER — CHLORHEXIDINE GLUCONATE CLOTH 2 % EX PADS: 6.0000 | MEDICATED_PAD | Freq: Once | CUTANEOUS | Status: DC

## 2023-01-08 MED ORDER — 0.9 % SODIUM CHLORIDE (POUR BTL) OPTIME
TOPICAL | Status: DC | PRN
  Administered 2023-01-08: 1000 mL

## 2023-01-08 MED ORDER — LATANOPROST 0.005 % OP SOLN
1.0000 [drp] | Freq: Every day | OPHTHALMIC | Status: DC
  Administered 2023-01-08: 1 [drp] via OPHTHALMIC
  Filled 2023-01-08: qty 2.5

## 2023-01-08 MED ORDER — ORAL CARE MOUTH RINSE: 15.0000 mL | Freq: Once | OROMUCOSAL | Status: AC

## 2023-01-08 MED ORDER — AMLODIPINE BESYLATE 10 MG PO TABS
10.0000 mg | ORAL_TABLET | Freq: Every day | ORAL | Status: DC
  Administered 2023-01-09: 10 mg via ORAL
  Filled 2023-01-08: qty 1

## 2023-01-08 MED ORDER — BUPIVACAINE-EPINEPHRINE (PF) 0.5% -1:200000 IJ SOLN
INTRAMUSCULAR | Status: DC | PRN
  Administered 2023-01-08: 20 mL via PERINEURAL

## 2023-01-08 MED ORDER — ACETAMINOPHEN 500 MG PO TABS
1000.0000 mg | ORAL_TABLET | Freq: Once | ORAL | Status: AC
  Administered 2023-01-08: 1000 mg via ORAL
  Filled 2023-01-08: qty 2

## 2023-01-08 MED ORDER — CHLORHEXIDINE GLUCONATE 0.12 % MT SOLN
15.0000 mL | Freq: Once | OROMUCOSAL | Status: AC
  Administered 2023-01-08: 15 mL via OROMUCOSAL
  Filled 2023-01-08: qty 15

## 2023-01-08 MED ORDER — LEVOTHYROXINE SODIUM 50 MCG PO TABS
50.0000 ug | ORAL_TABLET | Freq: Every day | ORAL | Status: DC
  Administered 2023-01-09: 50 ug via ORAL
  Filled 2023-01-08: qty 1

## 2023-01-08 MED ORDER — OXYCODONE HCL 5 MG PO TABS: 5.0000 mg | ORAL_TABLET | Freq: Four times a day (QID) | ORAL | Status: DC | PRN

## 2023-01-08 MED ORDER — ACETAMINOPHEN 650 MG RE SUPP: 650.0000 mg | Freq: Four times a day (QID) | RECTAL | Status: DC | PRN

## 2023-01-08 MED ORDER — ONDANSETRON HCL 4 MG/2ML IJ SOLN
INTRAMUSCULAR | Status: AC
  Filled 2023-01-08: qty 2

## 2023-01-08 MED ORDER — PHENYLEPHRINE 80 MCG/ML (10ML) SYRINGE FOR IV PUSH (FOR BLOOD PRESSURE SUPPORT)
PREFILLED_SYRINGE | INTRAVENOUS | Status: AC
  Filled 2023-01-08: qty 10

## 2023-01-08 MED ORDER — BUPIVACAINE LIPOSOME 1.3 % IJ SUSP
INTRAMUSCULAR | Status: AC
  Filled 2023-01-08: qty 10

## 2023-01-08 MED ORDER — SUCCINYLCHOLINE CHLORIDE 200 MG/10ML IV SOSY
PREFILLED_SYRINGE | INTRAVENOUS | Status: AC
  Filled 2023-01-08: qty 10

## 2023-01-08 MED ORDER — GLYCOPYRROLATE PF 0.2 MG/ML IJ SOSY
PREFILLED_SYRINGE | INTRAMUSCULAR | Status: AC
  Filled 2023-01-08: qty 1

## 2023-01-08 MED ORDER — ACETAMINOPHEN 325 MG PO TABS
650.0000 mg | ORAL_TABLET | Freq: Four times a day (QID) | ORAL | Status: DC | PRN
  Administered 2023-01-08 – 2023-01-09 (×2): 650 mg via ORAL
  Filled 2023-01-08 (×2): qty 2

## 2023-01-08 MED ORDER — LIDOCAINE 2% (20 MG/ML) 5 ML SYRINGE
INTRAMUSCULAR | Status: DC | PRN
  Administered 2023-01-08: 40 mg via INTRAVENOUS

## 2023-01-08 MED ORDER — DIPHENHYDRAMINE HCL 12.5 MG/5ML PO ELIX: 12.5000 mg | ORAL_SOLUTION | Freq: Four times a day (QID) | ORAL | Status: DC | PRN

## 2023-01-08 MED ORDER — OXYCODONE HCL 5 MG PO TABS: 5.0000 mg | ORAL_TABLET | Freq: Once | ORAL | Status: DC | PRN

## 2023-01-08 MED ORDER — FENTANYL CITRATE (PF) 100 MCG/2ML IJ SOLN: 25.0000 ug | INTRAMUSCULAR | Status: DC | PRN

## 2023-01-08 MED ORDER — SODIUM CHLORIDE 0.9 % IV SOLN: INTRAVENOUS | Status: DC

## 2023-01-08 MED ORDER — GABAPENTIN 100 MG PO CAPS: 100.0000 mg | ORAL_CAPSULE | Freq: Three times a day (TID) | ORAL | Status: DC | PRN

## 2023-01-08 MED ORDER — OXYCODONE HCL 5 MG/5ML PO SOLN: 5.0000 mg | Freq: Once | ORAL | Status: DC | PRN

## 2023-01-08 MED ORDER — ONDANSETRON 4 MG PO TBDP: 4.0000 mg | ORAL_TABLET | Freq: Four times a day (QID) | ORAL | Status: DC | PRN

## 2023-01-08 MED ORDER — PANTOPRAZOLE SODIUM 40 MG PO TBEC
40.0000 mg | DELAYED_RELEASE_TABLET | Freq: Every day | ORAL | Status: DC
  Administered 2023-01-09: 40 mg via ORAL
  Filled 2023-01-08 (×2): qty 1

## 2023-01-08 MED ORDER — POTASSIUM CITRATE ER 10 MEQ (1080 MG) PO TBCR
10.0000 meq | EXTENDED_RELEASE_TABLET | Freq: Every day | ORAL | Status: DC
  Administered 2023-01-09: 10 meq via ORAL
  Filled 2023-01-08: qty 1

## 2023-01-08 MED ORDER — LACTATED RINGERS IV SOLN: INTRAVENOUS | Status: DC

## 2023-01-08 SURGICAL SUPPLY — 46 items
APL PRP STRL LF DISP 70% ISPRP (MISCELLANEOUS) ×1
APL SKNCLS STERI-STRIP NONHPOA (GAUZE/BANDAGES/DRESSINGS) ×1
APPLIER CLIP 9.375 MED OPEN (MISCELLANEOUS) ×1
APR CLP MED 9.3 20 MLT OPN (MISCELLANEOUS) ×1
BAG COUNTER SPONGE SURGICOUNT (BAG) ×1 IMPLANT
BAG SPNG CNTER NS LX DISP (BAG) ×1
BENZOIN TINCTURE PRP APPL 2/3 (GAUZE/BANDAGES/DRESSINGS) IMPLANT
BINDER BREAST LRG (GAUZE/BANDAGES/DRESSINGS) IMPLANT
BINDER BREAST XLRG (GAUZE/BANDAGES/DRESSINGS) IMPLANT
BIOPATCH RED 1 DISK 7.0 (GAUZE/BANDAGES/DRESSINGS) ×2 IMPLANT
CANISTER SUCT 3000ML PPV (MISCELLANEOUS) ×1 IMPLANT
CHLORAPREP W/TINT 26 (MISCELLANEOUS) ×1 IMPLANT
CLIP APPLIE 9.375 MED OPEN (MISCELLANEOUS) ×1 IMPLANT
COVER SURGICAL LIGHT HANDLE (MISCELLANEOUS) ×1 IMPLANT
DRAIN CHANNEL 19F RND (DRAIN) ×1 IMPLANT
DRAPE LAPAROSCOPIC ABDOMINAL (DRAPES) ×1 IMPLANT
DRSG TEGADERM 4X4.5 CHG (GAUZE/BANDAGES/DRESSINGS) ×2 IMPLANT
ELECT CAUTERY BLADE 6.4 (BLADE) IMPLANT
ELECT REM PT RETURN 9FT ADLT (ELECTROSURGICAL) ×1
ELECTRODE REM PT RTRN 9FT ADLT (ELECTROSURGICAL) ×1 IMPLANT
EVACUATOR SILICONE 100CC (DRAIN) ×1 IMPLANT
GAUZE PAD ABD 8X10 STRL (GAUZE/BANDAGES/DRESSINGS) ×1 IMPLANT
GAUZE SPONGE 4X4 12PLY STRL (GAUZE/BANDAGES/DRESSINGS) ×1 IMPLANT
GAUZE SPONGE 4X4 12PLY STRL LF (GAUZE/BANDAGES/DRESSINGS) ×1 IMPLANT
GAUZE XEROFORM 1X8 LF (GAUZE/BANDAGES/DRESSINGS) ×2 IMPLANT
GLOVE BIO SURGEON STRL SZ7 (GLOVE) ×1 IMPLANT
GLOVE BIOGEL PI IND STRL 7.5 (GLOVE) ×1 IMPLANT
GOWN STRL REUS W/ TWL LRG LVL3 (GOWN DISPOSABLE) ×2 IMPLANT
GOWN STRL REUS W/TWL LRG LVL3 (GOWN DISPOSABLE) ×2
HEMOSTAT ARISTA ABSORB 3G PWDR (HEMOSTASIS) IMPLANT
KIT BASIN OR (CUSTOM PROCEDURE TRAY) ×1 IMPLANT
KIT TURNOVER KIT B (KITS) ×1 IMPLANT
NS IRRIG 1000ML POUR BTL (IV SOLUTION) ×1 IMPLANT
PACK GENERAL/GYN (CUSTOM PROCEDURE TRAY) ×1 IMPLANT
PAD ARMBOARD 7.5X6 YLW CONV (MISCELLANEOUS) ×1 IMPLANT
PENCIL SMOKE EVACUATOR (MISCELLANEOUS) ×1 IMPLANT
SPECIMEN JAR X LARGE (MISCELLANEOUS) ×1 IMPLANT
STRIP CLOSURE SKIN 1/2X4 (GAUZE/BANDAGES/DRESSINGS) IMPLANT
SUT ETHILON 2 0 FS 18 (SUTURE) IMPLANT
SUT ETHILON 3 0 FSL (SUTURE) ×1 IMPLANT
SUT MON AB 4-0 RB1 27 (SUTURE) IMPLANT
SUT SILK 2 0 SH (SUTURE) ×1 IMPLANT
SUT VIC AB 3-0 SH 18 (SUTURE) ×1 IMPLANT
TAPE CLOTH SURG 4X10 WHT LF (GAUZE/BANDAGES/DRESSINGS) IMPLANT
TOWEL GREEN STERILE (TOWEL DISPOSABLE) ×1 IMPLANT
TOWEL GREEN STERILE FF (TOWEL DISPOSABLE) ×1 IMPLANT

## 2023-01-08 NOTE — Progress Notes (Addendum)
Patient arrived to the unit via wheelchair from PACU.  A/ O x4. No pain complained at this time. VSS. Oriented patient to the room ans staff. Education provided regarding safety precaution.

## 2023-01-08 NOTE — Plan of Care (Signed)

## 2023-01-08 NOTE — Anesthesia Procedure Notes (Signed)
Procedure Name: Intubation Date/Time: 01/08/2023 9:03 AM  Performed by: Reggie Pile, CRNAPre-anesthesia Checklist: Patient identified, Emergency Drugs available, Suction available and Patient being monitored Patient Re-evaluated:Patient Re-evaluated prior to induction Oxygen Delivery Method: Circle system utilized Preoxygenation: Pre-oxygenation with 100% oxygen Induction Type: IV induction Ventilation: Mask ventilation without difficulty Laryngoscope Size: Glidescope and 3 Grade View: Grade I Tube type: Oral Tube size: 7.0 mm Number of attempts: 1 Airway Equipment and Method: Rigid stylet Placement Confirmation: ETT inserted through vocal cords under direct vision, positive ETCO2 and breath sounds checked- equal and bilateral Secured at: 20 cm Tube secured with: Tape Dental Injury: Teeth and Oropharynx as per pre-operative assessment

## 2023-01-08 NOTE — Transfer of Care (Signed)
Immediate Anesthesia Transfer of Care Note  Patient: ARLYNNE MASTRIANO  Procedure(s) Performed: LEFT TOTAL MASTECTOMY (Left: Breast)  Patient Location: PACU  Anesthesia Type:General  Level of Consciousness: awake and sedated  Airway & Oxygen Therapy: Patient Spontanous Breathing and Patient connected to nasal cannula oxygen  Post-op Assessment: Report given to RN and Post -op Vital signs reviewed and stable  Post vital signs: Reviewed and stable  Last Vitals:  Vitals Value Taken Time  BP 167/66 01/08/23 1024  Temp    Pulse 69 01/08/23 1026  Resp 20 01/08/23 1026  SpO2 99 % 01/08/23 1026  Vitals shown include unvalidated device data.  Last Pain:  Vitals:   01/08/23 0808  TempSrc:   PainSc: 0-No pain      Patients Stated Pain Goal: 0 (0000000 [id number])  Complications: No notable events documented.

## 2023-01-08 NOTE — Interval H&P Note (Signed)
History and Physical Interval Note:  01/08/2023 7:33 AM  Jessica Ayers  has presented today for surgery, with the diagnosis of LEFT BREAST INVASIVE DUCTAL CARCINOMA/DUCTAL CARCINOMA IN SITU.  The various methods of treatment have been discussed with the patient and family. After consideration of risks, benefits and other options for treatment, the patient has consented to  Procedure(s): LEFT TOTAL MASTECTOMY (Left) as a surgical intervention.  The patient's history has been reviewed, patient examined, no change in status, stable for surgery.  I have reviewed the patient's chart and labs.  Questions were answered to the patient's satisfaction.     Maia Petties

## 2023-01-08 NOTE — Anesthesia Procedure Notes (Signed)
Anesthesia Regional Block: Pectoralis block   Pre-Anesthetic Checklist: , timeout performed,  Correct Patient, Correct Site, Correct Laterality,  Correct Procedure, Correct Position, site marked,  Risks and benefits discussed,  Pre-op evaluation,  At surgeon's request and post-op pain management  Laterality: Left  Prep: Maximum Sterile Barrier Precautions used, chloraprep       Needles:  Injection technique: Single-shot  Needle Type: Echogenic Stimulator Needle     Needle Length: 9cm  Needle Gauge: 22     Additional Needles:   Procedures:,,,, ultrasound used (permanent image in chart),,    Narrative:  Start time: 01/08/2023 8:12 AM End time: 01/08/2023 8:15 AM Injection made incrementally with aspirations every 5 mL.  Performed by: Personally  Anesthesiologist: Brennan Bailey, MD  Additional Notes: Risks, benefits, and alternative discussed. Patient gave consent for procedure. Patient prepped and draped in sterile fashion. Sedation administered, patient remains easily responsive to voice. Relevant anatomy identified with ultrasound guidance. Local anesthetic given in 5cc increments with no signs or symptoms of intravascular injection. No pain or paraesthesias with injection. Patient monitored throughout procedure with signs of LAST or immediate complications. Tolerated well. Ultrasound image placed in chart.  Tawny Asal, MD

## 2023-01-08 NOTE — Anesthesia Postprocedure Evaluation (Signed)
Anesthesia Post Note  Patient: Jessica Ayers  Procedure(s) Performed: LEFT TOTAL MASTECTOMY (Left: Breast)     Patient location during evaluation: PACU Anesthesia Type: General Level of consciousness: awake and alert Pain management: pain level controlled Vital Signs Assessment: post-procedure vital signs reviewed and stable Respiratory status: spontaneous breathing, nonlabored ventilation and respiratory function stable Cardiovascular status: blood pressure returned to baseline Postop Assessment: no apparent nausea or vomiting Anesthetic complications: no   No notable events documented.  Last Vitals:  Vitals:   01/08/23 1100 01/08/23 1115  BP: (!) 157/69 (!) 160/68  Pulse: (!) 54 (!) 50  Resp: 18 19  Temp:    SpO2: 96% 97%    Last Pain:  Vitals:   01/08/23 1100  TempSrc:   PainSc: Asleep                 Marthenia Rolling

## 2023-01-08 NOTE — Op Note (Signed)
Pre-op diagnosis: Invasive ductal carcinoma left breast with surrounding ductal carcinoma in situ Postop diagnosis: Same Procedure performed: Left mastectomy Surgeon:Trysten Berti K Indyah Saulnier Anesthesia: General with pectoralis block Indications:This is an 82 year old female with no family history of breast cancer who presents after recent screening mammogram showed new calcifications in the central left breast. This area measured 5.8 x 2.9 x 2.4 cm. She underwent biopsy of the 2 areas of these calcifications. 1 of these showed invasive ductal carcinoma grade 2, ER/PR positive, HER2 positive, Ki-67 5%. She has extensive DCIS surrounding the primary tumor. The other biopsy was negative.  After discussion with the patient, we recommended mastectomy to assure complete excision of the ductal carcinoma in situ.  Description of procedure: The patient is brought to the operating room placed in the supine position on the operating room table.  After an adequate level of general anesthesia was obtained, her left chest was prepped with ChloraPrep and draped in sterile fashion.  A timeout was taken to ensure the proper patient and proper procedure.  I drew an elliptical incision around the nipple areolar complex.  We made our skin incisions with a #10 scalpel.  I raised skin flaps using cautery.  We went superiorly to the edge of the breast below the clavicle.  Medially we dissected to the edge of the sternum.  Inferiorly we dissected down to the inframammary crease.  Laterally we dissected to the anterior edge of the latissimus muscle.  We then dissected the breast tissue off of the underlying pectoralis muscle from medial to lateral.  The specimen was oriented with a long suture lateral and a short suture superior.  This was sent for pathologic examination.  We irrigated the wound thoroughly and inspected for hemostasis.  A 19 French drain was brought out through a stab incision laterally and secured with 2-0 Ethilon.  We  sprayed Arista powder throughout the entire wound.  The edges of the skin were reapproximated with dermal interrupted 3-0 Vicryl sutures.  4-0 Monocryl was used to close the skin.  Benzoin and Steri-Strips were applied.  The patient was then extubated and brought to the recovery room in stable condition.  All sponge, instrument, and needle counts are correct.  Jessica Burn. Georgette Dover, Jessica Ayers, [facility] Surgery  General Surgery   01/08/2023 10:20 AM

## 2023-01-09 ENCOUNTER — Encounter (HOSPITAL_COMMUNITY): Payer: Self-pay | Admitting: Surgery

## 2023-01-09 ENCOUNTER — Other Ambulatory Visit: Payer: Self-pay

## 2023-01-09 DIAGNOSIS — Z170 Estrogen receptor positive status [ER+]: Secondary | ICD-10-CM | POA: Diagnosis not present

## 2023-01-09 DIAGNOSIS — C50112 Malignant neoplasm of central portion of left female breast: Secondary | ICD-10-CM | POA: Diagnosis not present

## 2023-01-09 DIAGNOSIS — Z87891 Personal history of nicotine dependence: Secondary | ICD-10-CM | POA: Diagnosis not present

## 2023-01-09 DIAGNOSIS — I10 Essential (primary) hypertension: Secondary | ICD-10-CM | POA: Diagnosis not present

## 2023-01-09 DIAGNOSIS — Z79899 Other long term (current) drug therapy: Secondary | ICD-10-CM | POA: Diagnosis not present

## 2023-01-09 DIAGNOSIS — J45909 Unspecified asthma, uncomplicated: Secondary | ICD-10-CM | POA: Diagnosis not present

## 2023-01-09 DIAGNOSIS — Z7982 Long term (current) use of aspirin: Secondary | ICD-10-CM | POA: Diagnosis not present

## 2023-01-09 NOTE — Progress Notes (Signed)
Discharge instructions given. Patient verbalized understanding and all questions were answered. RN will do JP drain teaching with son once he arrives.

## 2023-01-09 NOTE — Discharge Summary (Signed)
Physician Discharge Summary  Patient ID: Jessica Ayers MRN: [id number] DOB/AGE: Jan 22, 1941 82 y.o.  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  She did well overnight with no sign of bleeding.  Only tylenol for pain control.    Treatments: surgery: left mastectomy  Discharge Exam: Blood pressure (!) 156/53, pulse (!) 55, temperature 98.2 F (36.8 C), temperature source Oral, resp. rate 16, height 5\' 1"  (1.549 m), weight 60.3 kg, SpO2 100 %. General appearance: alert, cooperative, and no distress Left chest wall - skin flaps viable Drain with serosanguinous output Disposition: Discharge disposition: 01-Home or Self Care       Discharge Instructions     Call MD for:  persistant nausea and vomiting   Complete by: As directed    Call MD for:  redness, tenderness, or signs of infection (pain, swelling, redness, odor or green/yellow discharge around incision site)   Complete by: As directed    Call MD for:  severe uncontrolled pain   Complete by: As directed    Call MD for:  temperature >100.4   Complete by: As directed    Diet general   Complete by: As directed    Discharge wound care:   Complete by: As directed    Wear the breast binder whenever you are out of bed Empty the drain and record the output 1-2 times per day Bring this information with you to the office next week. Sponge baths only - no showers until the drain is removed   Driving Restrictions   Complete by: As directed    Do not drive while taking pain medications   Increase activity slowly   Complete by: As directed       Allergies as of 01/09/2023       Reactions   Cyclobenzaprine    Unknown reaction   Lyrica [pregabalin]    Unknown reaction    Prednisone    Unknown reaction    Codeine Itching, Nausea And Vomiting, Rash   Morphine And Related Itching, Nausea And Vomiting, Rash        Medication List     TAKE these medications    albuterol 108 (90 Base) MCG/ACT inhaler Commonly known as: VENTOLIN HFA Inhale 1-2 puffs into the lungs every 4 (four) hours as needed for wheezing.   amLODipine 10 MG tablet Commonly known as: NORVASC Take 10 mg by mouth daily.   aspirin EC 81 MG tablet Take 81 mg by mouth daily. Swallow whole.   clotrimazole-betamethasone cream Commonly known as: LOTRISONE APPLY TO AFFECTED AREA TWICE DAILY What changed: See the new instructions.   dorzolamide-timolol 2-0.5 % ophthalmic solution Commonly known as: COSOPT Place 1 drop into both eyes 2 (two) times daily.   furosemide 20 MG tablet Commonly known as: LASIX Take 20 mg by mouth daily as needed for edema.   gabapentin 100 MG capsule Commonly known as: NEURONTIN Take 100 mg by mouth 3 (three) times daily as needed (pain).   ibuprofen 800 MG tablet Commonly known as: ADVIL Take 1 tablet (800 mg total) by mouth every 6 (six) hours as needed. What changed: how much to take   latanoprost 0.005 % ophthalmic solution Commonly known as: XALATAN Place 1 drop into both eyes at bedtime.   levocetirizine 5 MG tablet  Commonly known as: XYZAL TAKE 1 TABLET (5 MG TOTAL) BY MOUTH 2 (TWO) TIMES DAILY AS NEEDED FOR ALLERGIES (FOR BREAKTHROUGH SYMPTOMS).   levothyroxine 25 MCG tablet Commonly known as: SYNTHROID Take 25 mcg by mouth daily before breakfast.   levothyroxine 50 MCG tablet Commonly known as: SYNTHROID Take 50 mcg by mouth daily before breakfast.   losartan 50 MG tablet Commonly known as: COZAAR Take 50 mg by mouth daily.   meloxicam 15 MG tablet Commonly known as: MOBIC Take 15 mg by mouth daily as needed for pain.   nitroGLYCERIN 0.4 MG SL tablet Commonly known as: NITROSTAT Place 0.4 mg under the tongue every 5  (five) minutes as needed for chest pain.   omeprazole 20 MG capsule Commonly known as: PRILOSEC Take 20 mg by mouth daily.   oxyCODONE-acetaminophen 5-325 MG tablet Commonly known as: Percocet Take 1 tablet by mouth every 4 (four) hours as needed for severe pain. What changed: how much to take   potassium citrate 10 MEQ (1080 MG) SR tablet Commonly known as: UROCIT-K Take 10 mEq by mouth daily.   triamcinolone 55 MCG/ACT Aero nasal inhaler Commonly known as: NASACORT Place 2 sprays into the nose daily.   vitamin B-12 500 MCG tablet Commonly known as: CYANOCOBALAMIN Take 500 mcg by mouth daily.   Vitamin D (Ergocalciferol) 1.25 MG (50000 UNIT) Caps capsule Commonly known as: DRISDOL Take 50,000 Units by mouth every Friday.               Discharge Care Instructions  (From admission, onward)           Start     Ordered   01/09/23 0000  Discharge wound care:       Comments: Wear the breast binder whenever you are out of bed Empty the drain and record the output 1-2 times per day Bring this information with you to the office next week. Sponge baths only - no showers until the drain is removed   01/09/23 0732            Follow-up Information     Donnie Mesa, MD Follow up in 3 week(s).   Specialty: General Surgery Why: My office will call you with an appointment for next week to have your drain removed.  I will see you in 2-3 weeks. Contact information: [address] [address] [phone]                 Signed: Maia Petties 01/09/2023, 7:35 AM

## 2023-01-09 NOTE — Discharge Instructions (Signed)
[facility] [phone]  MASTECTOMY: POST OP INSTRUCTIONS  Always review your discharge instruction sheet given to you by the facility where your surgery was performed. IF YOU HAVE DISABILITY OR FAMILY LEAVE FORMS, YOU MUST BRING THEM TO THE OFFICE FOR PROCESSING.   DO NOT GIVE THEM TO YOUR DOCTOR. A prescription for pain medication may be given to you upon discharge.  Take your pain medication as prescribed, if needed.  If narcotic pain medicine is not needed, then you may take acetaminophen (Tylenol) or ibuprofen (Advil) as needed. Take your usually prescribed medications unless otherwise directed. If you need a refill on your pain medication, please contact your pharmacy.  They will contact our office to request authorization.  Prescriptions will not be filled after 5pm or on week-ends. You should follow a light diet the first few days after arrival home, such as soup and crackers, etc.  Resume your normal diet the day after surgery. Most patients will experience some swelling and bruising on the chest and underarm.  Ice packs will help.  Swelling and bruising can take several days to resolve.  It is common to experience some constipation if taking pain medication after surgery.  Increasing fluid intake and taking a stool softener (such as Colace) will usually help or prevent this problem from occurring.  A mild laxative (Milk of Magnesia or Miralax) should be taken according to package instructions if there are no bowel movements after 48 hours. Unless discharge instructions indicate otherwise, leave your bandage dry and in place until your next appointment in 3-5 days.  You may take a limited sponge bath.  No tube baths or showers until the drains are removed.  You may have steri-strips (small skin tapes) in place directly over the incision.  These strips should be left on the skin for 7-10 days.  If your surgeon used skin glue on the incision, you may shower in 24 hours.   The glue will flake off over the next 2-3 weeks.  Any sutures or staples will be removed at the office during your follow-up visit. DRAINS:  If you have drains in place, it is important to keep a list of the amount of drainage produced each day in your drains.  Before leaving the hospital, you should be instructed on drain care.  Call our office if you have any questions about your drains. ACTIVITIES:  You may resume regular (light) daily activities beginning the next day--such as daily self-care, walking, climbing stairs--gradually increasing activities as tolerated.  You may have sexual intercourse when it is comfortable.  Refrain from any heavy lifting or straining until approved by your doctor. You may drive when you are no longer taking prescription pain medication, you can comfortably wear a seatbelt, and you can safely maneuver your car and apply brakes. RETURN TO WORK:  __________________________________________________________ You should see your doctor in the office for a follow-up appointment approximately 3-5 days after your surgery.  Your doctor's nurse will typically make your follow-up appointment when she calls you with your pathology report.  Expect your pathology report 2-3 business days after your surgery.  You may call to check if you do not hear from us after three days.   OTHER INSTRUCTIONS: ______________________________________________________________________________________________ ____________________________________________________________________________________________ WHEN TO CALL YOUR DOCTOR: Fever over 101.0 Nausea and/or vomiting Extreme swelling or bruising Continued bleeding from incision. Increased pain, redness, or drainage from the incision. The clinic staff is available to answer your questions during regular business hours.  Please don't hesitate   to call and ask to speak to one of the nurses for clinical concerns.  If you have a medical emergency, go to the  nearest emergency room or call 911.  A surgeon from [facility] is always on call at the hospital. [address], [address], [place], [place]  [address] ? [address] [address], [place], [place]   [address] [phone] ? [phone] ? FAX [phone] Web site: [email or web address]  

## 2023-01-10 LAB — SURGICAL PATHOLOGY

## 2023-01-14 ENCOUNTER — Inpatient Hospital Stay: Payer: Medicare HMO

## 2023-01-14 ENCOUNTER — Encounter: Payer: Self-pay | Admitting: Hematology and Oncology

## 2023-01-14 ENCOUNTER — Other Ambulatory Visit: Payer: Self-pay

## 2023-01-14 ENCOUNTER — Inpatient Hospital Stay: Payer: Medicare HMO | Attending: Hematology and Oncology | Admitting: Hematology and Oncology

## 2023-01-14 VITALS — BP 144/49 | HR 51 | Temp 97.9°F | Resp 16 | Ht 61.0 in | Wt 123.3 lb

## 2023-01-14 DIAGNOSIS — M797 Fibromyalgia: Secondary | ICD-10-CM | POA: Insufficient documentation

## 2023-01-14 DIAGNOSIS — I10 Essential (primary) hypertension: Secondary | ICD-10-CM | POA: Insufficient documentation

## 2023-01-14 DIAGNOSIS — J45909 Unspecified asthma, uncomplicated: Secondary | ICD-10-CM | POA: Diagnosis not present

## 2023-01-14 DIAGNOSIS — Z79899 Other long term (current) drug therapy: Secondary | ICD-10-CM | POA: Insufficient documentation

## 2023-01-14 DIAGNOSIS — Z7982 Long term (current) use of aspirin: Secondary | ICD-10-CM | POA: Insufficient documentation

## 2023-01-14 DIAGNOSIS — Z9012 Acquired absence of left breast and nipple: Secondary | ICD-10-CM | POA: Diagnosis not present

## 2023-01-14 DIAGNOSIS — M8580 Other specified disorders of bone density and structure, unspecified site: Secondary | ICD-10-CM | POA: Insufficient documentation

## 2023-01-14 DIAGNOSIS — Z170 Estrogen receptor positive status [ER+]: Secondary | ICD-10-CM | POA: Diagnosis not present

## 2023-01-14 DIAGNOSIS — C50912 Malignant neoplasm of unspecified site of left female breast: Secondary | ICD-10-CM | POA: Diagnosis not present

## 2023-01-14 DIAGNOSIS — M1990 Unspecified osteoarthritis, unspecified site: Secondary | ICD-10-CM | POA: Diagnosis not present

## 2023-01-14 DIAGNOSIS — E559 Vitamin D deficiency, unspecified: Secondary | ICD-10-CM | POA: Diagnosis not present

## 2023-01-14 DIAGNOSIS — E039 Hypothyroidism, unspecified: Secondary | ICD-10-CM | POA: Insufficient documentation

## 2023-01-14 DIAGNOSIS — D0512 Intraductal carcinoma in situ of left breast: Secondary | ICD-10-CM | POA: Diagnosis not present

## 2023-01-14 DIAGNOSIS — K219 Gastro-esophageal reflux disease without esophagitis: Secondary | ICD-10-CM | POA: Diagnosis not present

## 2023-01-14 MED ORDER — ANASTROZOLE 1 MG PO TABS: 1.0000 mg | ORAL_TABLET | Freq: Every day | ORAL | 3 refills | Status: DC

## 2023-01-14 NOTE — Progress Notes (Signed)
[name] CONSULT NOTE  Patient Care Team: Mckinley Jewel, MD as PCP - General (Internal Medicine) Benay Pike, MD as Consulting Physician (Hematology and Oncology)  CHIEF COMPLAINTS/PURPOSE OF CONSULTATION:  DCIS  ASSESSMENT & PLAN:   This is a very pleasant 82 year old female patient with newly diagnosed left breast DCIS with focal invasive component noted on the initial biopsy but not on the final sample referred to medical oncology for recommendations.  We have reviewed the biopsy results which showed HER2 amplified ER/PR +1.25 mm invasive ductal carcinoma, grade 2 with the proliferation index of 5%.  However on her final mastectomy specimen, there is no evidence of invasive carcinoma.  All margins are negative and positive for DCIS ER/PR positive.  Given the size of the tumor less than 5 mm, although it is HER2 amplified we have discussed about considering adjuvant Herceptin but discussed that this is a category 2B recommendation at this time.  With regards to antiestrogen therapy I recommended antiestrogen therapy for 5 years, discussed about aromatase inhibitors and reviewed adverse effects including but not limited to menopausal symptoms such as hot flashes, arthralgias and bone density loss.  Have ordered baseline bone density.  Patient is reluctant to consider Herceptin since the tumor is very small and there is no evidence of invasive cancer on the final sample and since the margins are negative.  She is however willing to proceed with antiestrogen therapy which is reasonable.  She will return to clinic in about 3 months for toxicity check.  All questions were answered to the best of my knowledge.  Thank you for consulting [place] in the care of this patient.  Please not hesitate to contact us with any additional questions or concerns.  HISTORY OF PRESENTING ILLNESS:   Jessica Ayers 82 y.o. female is here because of DCIS and IDC left breast.  This is a very pleasant  82 year old female patient with past medical history significant for osteoarthritis, fibromyalgia, hypertension who had new calcifications in the left breast compared to her prior mammograms back in August which was recommended to be further evaluated.  She then had diagnostic mammogram which showed 5.8 cm group of left breast cal suspicious for malignancy. She then had left breast needle core biopsy of the upper central location which showed focal invasive moderately differentiated adenocarcinoma grade 2, extensive DCIS, prognostics from the invasive sample showed ER 95% positive strong staining PR 30% positive strong staining Ki-67 of 5% and HER2 3+ by IHC. She then had left breast mastectomy which showed DCIS, solid and cribriform types, nuclear grade 2, negative margins, no evidence of invasive cancer on the final sample.  Prognostic showed ER 95% positive, strong staining intensity, PR 30% positive strong staining intensity, Ki-67 of 5%.  We have reviewed the final pathology results as well as the biopsy results which showed a HER2 amplified invasive ductal adenocarcinoma [name] is here for an initial visit with her son today for follow-up.  She has been healing well from mastectomy.  She denies any complaints today.  REVIEW OF SYSTEMS:   Constitutional: Denies fevers, chills or abnormal night sweats Eyes: Denies blurriness of vision, double vision or watery eyes Ears, nose, mouth, throat, and face: Denies mucositis or sore throat Respiratory: Denies cough, dyspnea or wheezes Cardiovascular: Denies palpitation, chest discomfort or lower extremity swelling Gastrointestinal:  Denies nausea, heartburn or change in bowel habits Skin: Denies abnormal skin rashes Lymphatics: Denies new lymphadenopathy or easy bruising Neurological:Denies numbness, tingling or new  weaknesses Behavioral/Psych: Mood is stable, no new changes  All other systems were reviewed with the patient and are  negative.  MEDICAL HISTORY:  Past Medical History:  Diagnosis Date   Asthma    Benign essential hypertension    Esophageal reflux    Fibromyalgia    Hypothyroidism    Osteoarthritis    Osteopenia    Vitamin D deficiency     SURGICAL HISTORY: Past Surgical History:  Procedure Laterality Date   APPENDECTOMY     BREAST BIOPSY Left 12/06/2022   MM LT BREAST BX W LOC DEV EA AD LESION IMG BX SPEC STEREO GUIDE 12/06/2022 [facility]   BREAST BIOPSY Left 12/06/2022   MM LT BREAST BX W LOC DEV 1ST LESION IMAGE BX SPEC STEREO GUIDE 12/06/2022 [facility]   EYE SURGERY     METATARSAL OSTEOTOMY Left 12/30/2022   4TH LEFT   TOTAL MASTECTOMY Left 01/08/2023   Procedure: LEFT TOTAL MASTECTOMY;  Surgeon: Donnie Mesa, MD;  Location: [facility] OR;  Service: General;  Laterality: Left;   TUBAL LIGATION      SOCIAL HISTORY: Social History   Socioeconomic History   Marital status: Married    Spouse name: Not on file   Number of children: Not on file   Years of education: Not on file   Highest education level: Not on file  Occupational History   Not on file  Tobacco Use   Smoking status: Never   Smokeless tobacco: Never  Vaping Use   Vaping Use: Never used  Substance and Sexual Activity   Alcohol use: Not Currently   Drug use: Never   Sexual activity: Not on file  Other Topics Concern   Not on file  Social History Narrative   Not on file   Social Determinants of Health   Financial Resource Strain: Not on file  Food Insecurity: No Food Insecurity (01/09/2023)   Hunger Vital Sign    Worried About Running Out of Food in the Last Year: Never true    Ran Out of Food in the Last Year: Never true  Transportation Needs: No Transportation Needs (01/09/2023)   PRAPARE - Hydrologist (Medical): No    Lack of Transportation (Non-Medical): No  Physical Activity: Not on file  Stress: Not on file  Social Connections: Not on file  Intimate Partner  Violence: Not At Risk (01/09/2023)   Humiliation, Afraid, Rape, and Kick questionnaire    Fear of Current or Ex-Partner: No    Emotionally Abused: No    Physically Abused: No    Sexually Abused: No    FAMILY HISTORY: Family History  Problem Relation Age of Onset   CAD Father    Breast cancer Neg Hx     ALLERGIES:  is allergic to cyclobenzaprine, lyrica [pregabalin], prednisone, codeine, and morphine and related.  MEDICATIONS:  Current Outpatient Medications  Medication Sig Dispense Refill   albuterol (VENTOLIN HFA) 108 (90 Base) MCG/ACT inhaler Inhale 1-2 puffs into the lungs every 4 (four) hours as needed for wheezing.     amLODipine (NORVASC) 10 MG tablet Take 10 mg by mouth daily.     aspirin EC 81 MG tablet Take 81 mg by mouth daily. Swallow whole.     clotrimazole-betamethasone (LOTRISONE) cream APPLY TO AFFECTED AREA TWICE DAILY (Patient taking differently: Apply 1 Application topically 2 (two) times daily.) 90 g 3   dorzolamide-timolol (COSOPT) 2-0.5 % ophthalmic solution Place 1 drop into both eyes 2 (two) times  daily.     furosemide (LASIX) 20 MG tablet Take 20 mg by mouth daily as needed for edema.     gabapentin (NEURONTIN) 100 MG capsule Take 100 mg by mouth 3 (three) times daily as needed (pain).     ibuprofen (ADVIL) 800 MG tablet Take 1 tablet (800 mg total) by mouth every 6 (six) hours as needed. (Patient taking differently: Take 400 mg by mouth every 6 (six) hours as needed.) 60 tablet 1   latanoprost (XALATAN) 0.005 % ophthalmic solution Place 1 drop into both eyes at bedtime.     levocetirizine (XYZAL) 5 MG tablet TAKE 1 TABLET (5 MG TOTAL) BY MOUTH 2 (TWO) TIMES DAILY AS NEEDED FOR ALLERGIES (FOR BREAKTHROUGH SYMPTOMS). 60 tablet 1   levothyroxine (SYNTHROID) 25 MCG tablet Take 25 mcg by mouth daily before breakfast.     levothyroxine (SYNTHROID) 50 MCG tablet Take 50 mcg by mouth daily before breakfast.     losartan (COZAAR) 50 MG tablet Take 50 mg by mouth daily.      meloxicam (MOBIC) 15 MG tablet Take 15 mg by mouth daily as needed for pain.     nitroGLYCERIN (NITROSTAT) 0.4 MG SL tablet Place 0.4 mg under the tongue every 5 (five) minutes as needed for chest pain.     omeprazole (PRILOSEC) 20 MG capsule Take 20 mg by mouth daily.     oxyCODONE-acetaminophen (PERCOCET) 5-325 MG tablet Take 1 tablet by mouth every 4 (four) hours as needed for severe pain. (Patient taking differently: Take 0.5 tablets by mouth every 4 (four) hours as needed for severe pain.) 30 tablet 0   potassium citrate (UROCIT-K) 10 MEQ (1080 MG) SR tablet Take 10 mEq by mouth daily.     triamcinolone (NASACORT) 55 MCG/ACT AERO nasal inhaler Place 2 sprays into the nose daily. 16.5 g 2   vitamin B-12 (CYANOCOBALAMIN) 500 MCG tablet Take 500 mcg by mouth daily.     Vitamin D, Ergocalciferol, (DRISDOL) 1.25 MG (50000 UNIT) CAPS capsule Take 50,000 Units by mouth every Friday.     No current facility-administered medications for this visit.     PHYSICAL EXAMINATION: ECOG PERFORMANCE STATUS: 0 - Asymptomatic  Vitals:   01/14/23 1414  BP: (!) 144/49  Pulse: (!) 51  Resp: 16  Temp: 97.9 F (36.6 C)  SpO2: 99%   Filed Weights   01/14/23 1414  Weight: 123 lb 4.8 oz (55.9 kg)    GENERAL:alert, no distress and comfortable Left breast and dressing.  Drain with minimal drainage.  I opened part of the dressing which appears well.  No concern for infection to the extent of my exam Left foot in dressing. Varcose veins right leg.  LABORATORY DATA:  I have reviewed the data as listed Lab Results  Component Value Date   WBC 8.6 01/06/2023   HGB 14.1 01/06/2023   HCT 41.1 01/06/2023   MCV 91.3 01/06/2023   PLT 225 01/06/2023     Chemistry      Component Value Date/Time   NA 137 01/06/2023 1604   K 3.7 01/06/2023 1604   CL 105 01/06/2023 1604   CO2 22 01/06/2023 1604   BUN 19 01/06/2023 1604   CREATININE 1.02 (H) 01/06/2023 1604      Component Value Date/Time   CALCIUM  9.3 01/06/2023 1604       RADIOGRAPHIC STUDIES: I have personally reviewed the radiological images as listed and agreed with the findings in the report. DG Foot Complete Left  Result  Date: 01/07/2023 Please see detailed radiograph report in office note.   All questions were answered. The patient knows to call the clinic with any problems, questions or concerns. I spent 45 minutes in the care of this patient including H and P, review of records, counseling and coordination of care.     Benay Pike, MD 01/14/2023 2:16 PM

## 2023-01-15 ENCOUNTER — Encounter: Payer: Self-pay | Admitting: *Deleted

## 2023-01-15 DIAGNOSIS — C50912 Malignant neoplasm of unspecified site of left female breast: Secondary | ICD-10-CM

## 2023-01-20 DIAGNOSIS — M533 Sacrococcygeal disorders, not elsewhere classified: Secondary | ICD-10-CM | POA: Diagnosis not present

## 2023-01-21 ENCOUNTER — Ambulatory Visit: Payer: Medicare HMO

## 2023-01-21 ENCOUNTER — Ambulatory Visit (INDEPENDENT_AMBULATORY_CARE_PROVIDER_SITE_OTHER): Payer: Medicare HMO | Admitting: Podiatry

## 2023-01-21 DIAGNOSIS — Z9889 Other specified postprocedural states: Secondary | ICD-10-CM

## 2023-01-21 DIAGNOSIS — M216X2 Other acquired deformities of left foot: Secondary | ICD-10-CM

## 2023-01-21 MED ORDER — KETOCONAZOLE 2 % EX CREA: 1.0000 | TOPICAL_CREAM | Freq: Every day | CUTANEOUS | 0 refills | Status: AC

## 2023-01-21 NOTE — Progress Notes (Signed)
Subjective:  Patient ID: Jessica Ayers, female    DOB: [birth year],  MRN: [id number]  Chief Complaint  Patient presents with   Post-op Follow-up    POV # 2 DOS 12/30/22 --- LEFT 4TH FLOATING OSTEOTOMY, NO PAIN    DOS: 12/30/2022 Procedure: Left floating osteotomy  82 y.o. female returns for second post-op check.  Patient states she is doing well.  Denies pain dressing clean dry and intact.  Has been walking with postop shoe on.  Coming for suture removal  Review of Systems: Negative except as noted in the HPI. Denies N/V/F/Ch.  Past Medical History:  Diagnosis Date   Asthma    Benign essential hypertension    Esophageal reflux    Fibromyalgia    Hypothyroidism    Osteoarthritis    Osteopenia    Vitamin D deficiency     Current Outpatient Medications:    albuterol (VENTOLIN HFA) 108 (90 Base) MCG/ACT inhaler, Inhale 1-2 puffs into the lungs every 4 (four) hours as needed for wheezing., Disp: , Rfl:    anastrozole (ARIMIDEX) 1 MG tablet, Take 1 tablet (1 mg total) by mouth daily., Disp: 90 tablet, Rfl: 3   aspirin EC 81 MG tablet, Take 81 mg by mouth daily. Swallow whole., Disp: , Rfl:    clotrimazole-betamethasone (LOTRISONE) cream, APPLY TO AFFECTED AREA TWICE DAILY (Patient taking differently: Apply 1 Application topically 2 (two) times daily.), Disp: 90 g, Rfl: 3   dorzolamide-timolol (COSOPT) 2-0.5 % ophthalmic solution, Place 1 drop into both eyes 2 (two) times daily., Disp: , Rfl:    furosemide (LASIX) 20 MG tablet, Take 20 mg by mouth daily as needed for edema., Disp: , Rfl:    gabapentin (NEURONTIN) 100 MG capsule, Take 100 mg by mouth 3 (three) times daily as needed (pain)., Disp: , Rfl:    ibuprofen (ADVIL) 800 MG tablet, Take 1 tablet (800 mg total) by mouth every 6 (six) hours as needed. (Patient taking differently: Take 400 mg by mouth every 6 (six) hours as needed.), Disp: 60 tablet, Rfl: 1   ketoconazole (NIZORAL) 2 % cream, Apply 1 Application topically daily.,  Disp: 60 g, Rfl: 0   latanoprost (XALATAN) 0.005 % ophthalmic solution, Place 1 drop into both eyes at bedtime., Disp: , Rfl:    levocetirizine (XYZAL) 5 MG tablet, TAKE 1 TABLET (5 MG TOTAL) BY MOUTH 2 (TWO) TIMES DAILY AS NEEDED FOR ALLERGIES (FOR BREAKTHROUGH SYMPTOMS)., Disp: 60 tablet, Rfl: 1   levothyroxine (SYNTHROID) 25 MCG tablet, Take 25 mcg by mouth daily before breakfast., Disp: , Rfl:    levothyroxine (SYNTHROID) 50 MCG tablet, Take 50 mcg by mouth daily before breakfast., Disp: , Rfl:    losartan (COZAAR) 50 MG tablet, Take 50 mg by mouth daily., Disp: , Rfl:    meloxicam (MOBIC) 15 MG tablet, Take 15 mg by mouth daily as needed for pain., Disp: , Rfl:    nitroGLYCERIN (NITROSTAT) 0.4 MG SL tablet, Place 0.4 mg under the tongue every 5 (five) minutes as needed for chest pain., Disp: , Rfl:    omeprazole (PRILOSEC) 20 MG capsule, Take 20 mg by mouth daily., Disp: , Rfl:    oxyCODONE-acetaminophen (PERCOCET) 5-325 MG tablet, Take 1 tablet by mouth every 4 (four) hours as needed for severe pain. (Patient taking differently: Take 0.5 tablets by mouth every 4 (four) hours as needed for severe pain.), Disp: 30 tablet, Rfl: 0   potassium citrate (UROCIT-K) 10 MEQ (1080 MG) SR tablet, Take 10 mEq by  mouth daily., Disp: , Rfl:    triamcinolone (NASACORT) 55 MCG/ACT AERO nasal inhaler, Place 2 sprays into the nose daily., Disp: 16.5 g, Rfl: 2   vitamin B-12 (CYANOCOBALAMIN) 500 MCG tablet, Take 500 mcg by mouth daily., Disp: , Rfl:    Vitamin D, Ergocalciferol, (DRISDOL) 1.25 MG (50000 UNIT) CAPS capsule, Take 50,000 Units by mouth every Friday., Disp: , Rfl:    amLODipine (NORVASC) 10 MG tablet, Take 10 mg by mouth daily. (Patient not taking: Reported on 01/21/2023), Disp: , Rfl:   Social History   Tobacco Use  Smoking Status Never  Smokeless Tobacco Never    Allergies  Allergen Reactions   Cyclobenzaprine     Unknown reaction   Lyrica [Pregabalin]     Unknown reaction   Prednisone      Unknown reaction    Codeine Itching, Nausea And Vomiting and Rash   Morphine And Related Itching, Nausea And Vomiting and Rash   Objective:   There were no vitals filed for this visit.  There is no height or weight on file to calculate BMI. Constitutional Well developed. Well nourished.  Vascular Foot warm and well perfused. Capillary refill normal to all digits.   Neurologic Normal speech. Oriented to person, place, and time. Epicritic sensation to light touch grossly present bilaterally.  Dermatologic Skin healing well without signs of infection. Skin edges well coapted without signs of infection.  Orthopedic: No tenderness to palpation noted about the surgical site.   Radiographs: Deferred at this visit Assessment:   1. Status post surgery   2. Plantar flexed metatarsal, left     Plan:  Patient was evaluated and treated and all questions answered.  S/p foot surgery left floating osteotomy metatarsal left foot -Progressing as expected post-operatively. -XR: See above -WB Status: Weightbearing as tolerated in surgical shoe for another week and then transition back to regular shoes -Sutures: Removed in total this visit -Medications: None -Foot redressed with Steri-Strips and Ace wrap -Okay to begin getting foot wet and wash with warm soapy water   No follow-ups on file.

## 2023-02-04 DIAGNOSIS — Z9889 Other specified postprocedural states: Secondary | ICD-10-CM | POA: Diagnosis not present

## 2023-02-04 DIAGNOSIS — R200 Anesthesia of skin: Secondary | ICD-10-CM | POA: Diagnosis not present

## 2023-02-04 DIAGNOSIS — D0512 Intraductal carcinoma in situ of left breast: Secondary | ICD-10-CM | POA: Diagnosis not present

## 2023-02-04 DIAGNOSIS — N183 Chronic kidney disease, stage 3 unspecified: Secondary | ICD-10-CM | POA: Diagnosis not present

## 2023-02-05 DIAGNOSIS — C50912 Malignant neoplasm of unspecified site of left female breast: Secondary | ICD-10-CM | POA: Diagnosis not present

## 2023-02-06 DIAGNOSIS — N183 Chronic kidney disease, stage 3 unspecified: Secondary | ICD-10-CM | POA: Diagnosis not present

## 2023-02-06 DIAGNOSIS — M25511 Pain in right shoulder: Secondary | ICD-10-CM | POA: Diagnosis not present

## 2023-02-06 DIAGNOSIS — G629 Polyneuropathy, unspecified: Secondary | ICD-10-CM | POA: Diagnosis not present

## 2023-02-06 DIAGNOSIS — M170 Bilateral primary osteoarthritis of knee: Secondary | ICD-10-CM | POA: Diagnosis not present

## 2023-02-06 DIAGNOSIS — M5136 Other intervertebral disc degeneration, lumbar region: Secondary | ICD-10-CM | POA: Diagnosis not present

## 2023-02-06 DIAGNOSIS — D0512 Intraductal carcinoma in situ of left breast: Secondary | ICD-10-CM | POA: Diagnosis not present

## 2023-02-06 DIAGNOSIS — G894 Chronic pain syndrome: Secondary | ICD-10-CM | POA: Diagnosis not present

## 2023-02-06 DIAGNOSIS — I129 Hypertensive chronic kidney disease with stage 1 through stage 4 chronic kidney disease, or unspecified chronic kidney disease: Secondary | ICD-10-CM | POA: Diagnosis not present

## 2023-02-06 DIAGNOSIS — R200 Anesthesia of skin: Secondary | ICD-10-CM | POA: Diagnosis not present

## 2023-02-11 DIAGNOSIS — N183 Chronic kidney disease, stage 3 unspecified: Secondary | ICD-10-CM | POA: Diagnosis not present

## 2023-02-11 DIAGNOSIS — I129 Hypertensive chronic kidney disease with stage 1 through stage 4 chronic kidney disease, or unspecified chronic kidney disease: Secondary | ICD-10-CM | POA: Diagnosis not present

## 2023-02-11 DIAGNOSIS — G894 Chronic pain syndrome: Secondary | ICD-10-CM | POA: Diagnosis not present

## 2023-02-11 DIAGNOSIS — M25511 Pain in right shoulder: Secondary | ICD-10-CM | POA: Diagnosis not present

## 2023-02-11 DIAGNOSIS — M5136 Other intervertebral disc degeneration, lumbar region: Secondary | ICD-10-CM | POA: Diagnosis not present

## 2023-02-11 DIAGNOSIS — G629 Polyneuropathy, unspecified: Secondary | ICD-10-CM | POA: Diagnosis not present

## 2023-02-11 DIAGNOSIS — R200 Anesthesia of skin: Secondary | ICD-10-CM | POA: Diagnosis not present

## 2023-02-11 DIAGNOSIS — D0512 Intraductal carcinoma in situ of left breast: Secondary | ICD-10-CM | POA: Diagnosis not present

## 2023-02-11 DIAGNOSIS — M170 Bilateral primary osteoarthritis of knee: Secondary | ICD-10-CM | POA: Diagnosis not present

## 2023-02-12 DIAGNOSIS — M5136 Other intervertebral disc degeneration, lumbar region: Secondary | ICD-10-CM | POA: Diagnosis not present

## 2023-02-12 DIAGNOSIS — R200 Anesthesia of skin: Secondary | ICD-10-CM | POA: Diagnosis not present

## 2023-02-12 DIAGNOSIS — G894 Chronic pain syndrome: Secondary | ICD-10-CM | POA: Diagnosis not present

## 2023-02-12 DIAGNOSIS — M25511 Pain in right shoulder: Secondary | ICD-10-CM | POA: Diagnosis not present

## 2023-02-12 DIAGNOSIS — D0512 Intraductal carcinoma in situ of left breast: Secondary | ICD-10-CM | POA: Diagnosis not present

## 2023-02-12 DIAGNOSIS — M170 Bilateral primary osteoarthritis of knee: Secondary | ICD-10-CM | POA: Diagnosis not present

## 2023-02-12 DIAGNOSIS — G629 Polyneuropathy, unspecified: Secondary | ICD-10-CM | POA: Diagnosis not present

## 2023-02-12 DIAGNOSIS — N183 Chronic kidney disease, stage 3 unspecified: Secondary | ICD-10-CM | POA: Diagnosis not present

## 2023-02-12 DIAGNOSIS — I129 Hypertensive chronic kidney disease with stage 1 through stage 4 chronic kidney disease, or unspecified chronic kidney disease: Secondary | ICD-10-CM | POA: Diagnosis not present

## 2023-02-17 DIAGNOSIS — N183 Chronic kidney disease, stage 3 unspecified: Secondary | ICD-10-CM | POA: Diagnosis not present

## 2023-02-17 DIAGNOSIS — G629 Polyneuropathy, unspecified: Secondary | ICD-10-CM | POA: Diagnosis not present

## 2023-02-17 DIAGNOSIS — I129 Hypertensive chronic kidney disease with stage 1 through stage 4 chronic kidney disease, or unspecified chronic kidney disease: Secondary | ICD-10-CM | POA: Diagnosis not present

## 2023-02-17 DIAGNOSIS — M170 Bilateral primary osteoarthritis of knee: Secondary | ICD-10-CM | POA: Diagnosis not present

## 2023-02-17 DIAGNOSIS — D0512 Intraductal carcinoma in situ of left breast: Secondary | ICD-10-CM | POA: Diagnosis not present

## 2023-02-17 DIAGNOSIS — R200 Anesthesia of skin: Secondary | ICD-10-CM | POA: Diagnosis not present

## 2023-02-17 DIAGNOSIS — M5136 Other intervertebral disc degeneration, lumbar region: Secondary | ICD-10-CM | POA: Diagnosis not present

## 2023-02-17 DIAGNOSIS — G894 Chronic pain syndrome: Secondary | ICD-10-CM | POA: Diagnosis not present

## 2023-02-17 DIAGNOSIS — M25511 Pain in right shoulder: Secondary | ICD-10-CM | POA: Diagnosis not present

## 2023-02-18 DIAGNOSIS — G629 Polyneuropathy, unspecified: Secondary | ICD-10-CM | POA: Diagnosis not present

## 2023-02-18 DIAGNOSIS — D0512 Intraductal carcinoma in situ of left breast: Secondary | ICD-10-CM | POA: Diagnosis not present

## 2023-02-18 DIAGNOSIS — M170 Bilateral primary osteoarthritis of knee: Secondary | ICD-10-CM | POA: Diagnosis not present

## 2023-02-18 DIAGNOSIS — G894 Chronic pain syndrome: Secondary | ICD-10-CM | POA: Diagnosis not present

## 2023-02-18 DIAGNOSIS — R200 Anesthesia of skin: Secondary | ICD-10-CM | POA: Diagnosis not present

## 2023-02-18 DIAGNOSIS — M25511 Pain in right shoulder: Secondary | ICD-10-CM | POA: Diagnosis not present

## 2023-02-18 DIAGNOSIS — M5136 Other intervertebral disc degeneration, lumbar region: Secondary | ICD-10-CM | POA: Diagnosis not present

## 2023-02-18 DIAGNOSIS — I129 Hypertensive chronic kidney disease with stage 1 through stage 4 chronic kidney disease, or unspecified chronic kidney disease: Secondary | ICD-10-CM | POA: Diagnosis not present

## 2023-02-18 DIAGNOSIS — N183 Chronic kidney disease, stage 3 unspecified: Secondary | ICD-10-CM | POA: Diagnosis not present

## 2023-02-19 ENCOUNTER — Ambulatory Visit (INDEPENDENT_AMBULATORY_CARE_PROVIDER_SITE_OTHER): Payer: Medicare HMO

## 2023-02-19 ENCOUNTER — Ambulatory Visit (INDEPENDENT_AMBULATORY_CARE_PROVIDER_SITE_OTHER): Payer: Medicare HMO | Admitting: Podiatry

## 2023-02-19 DIAGNOSIS — Z9889 Other specified postprocedural states: Secondary | ICD-10-CM

## 2023-02-19 DIAGNOSIS — I129 Hypertensive chronic kidney disease with stage 1 through stage 4 chronic kidney disease, or unspecified chronic kidney disease: Secondary | ICD-10-CM | POA: Diagnosis not present

## 2023-02-19 DIAGNOSIS — G629 Polyneuropathy, unspecified: Secondary | ICD-10-CM | POA: Diagnosis not present

## 2023-02-19 DIAGNOSIS — R200 Anesthesia of skin: Secondary | ICD-10-CM | POA: Diagnosis not present

## 2023-02-19 DIAGNOSIS — D0512 Intraductal carcinoma in situ of left breast: Secondary | ICD-10-CM | POA: Diagnosis not present

## 2023-02-19 DIAGNOSIS — M25511 Pain in right shoulder: Secondary | ICD-10-CM | POA: Diagnosis not present

## 2023-02-19 DIAGNOSIS — N183 Chronic kidney disease, stage 3 unspecified: Secondary | ICD-10-CM | POA: Diagnosis not present

## 2023-02-19 DIAGNOSIS — M170 Bilateral primary osteoarthritis of knee: Secondary | ICD-10-CM | POA: Diagnosis not present

## 2023-02-19 DIAGNOSIS — M5136 Other intervertebral disc degeneration, lumbar region: Secondary | ICD-10-CM | POA: Diagnosis not present

## 2023-02-19 DIAGNOSIS — M216X2 Other acquired deformities of left foot: Secondary | ICD-10-CM

## 2023-02-19 DIAGNOSIS — G894 Chronic pain syndrome: Secondary | ICD-10-CM | POA: Diagnosis not present

## 2023-02-19 NOTE — Progress Notes (Signed)
Subjective:  Patient ID: Jessica Ayers, female    DOB: 02-07-1941,  MRN: [id number]  Chief Complaint  Patient presents with   Routine Post Op    POV # 3 DOS 12/30/22 --- LEFT 4TH FLOATING OSTEOTOMY    DOS: 12/30/2022 Procedure: Left floating osteotomy  82 y.o. female returns for second post-op check.  Patient states she is doing well.  Denies pain dressing clean dry and intact.  Has been walking with postop shoe on.  Coming for suture removal  Review of Systems: Negative except as noted in the HPI. Denies N/V/F/Ch.  Past Medical History:  Diagnosis Date   Asthma    Benign essential hypertension    Esophageal reflux    Fibromyalgia    Hypothyroidism    Osteoarthritis    Osteopenia    Vitamin D deficiency     Current Outpatient Medications:    albuterol (VENTOLIN HFA) 108 (90 Base) MCG/ACT inhaler, Inhale 1-2 puffs into the lungs every 4 (four) hours as needed for wheezing., Disp: , Rfl:    amLODipine (NORVASC) 10 MG tablet, Take 10 mg by mouth daily. (Patient not taking: Reported on 01/21/2023), Disp: , Rfl:    anastrozole (ARIMIDEX) 1 MG tablet, Take 1 tablet (1 mg total) by mouth daily., Disp: 90 tablet, Rfl: 3   aspirin EC 81 MG tablet, Take 81 mg by mouth daily. Swallow whole., Disp: , Rfl:    clotrimazole-betamethasone (LOTRISONE) cream, APPLY TO AFFECTED AREA TWICE DAILY (Patient taking differently: Apply 1 Application topically 2 (two) times daily.), Disp: 90 g, Rfl: 3   dorzolamide-timolol (COSOPT) 2-0.5 % ophthalmic solution, Place 1 drop into both eyes 2 (two) times daily., Disp: , Rfl:    furosemide (LASIX) 20 MG tablet, Take 20 mg by mouth daily as needed for edema., Disp: , Rfl:    gabapentin (NEURONTIN) 100 MG capsule, Take 100 mg by mouth 3 (three) times daily as needed (pain)., Disp: , Rfl:    ibuprofen (ADVIL) 800 MG tablet, Take 1 tablet (800 mg total) by mouth every 6 (six) hours as needed. (Patient taking differently: Take 400 mg by mouth every 6 (six) hours as  needed.), Disp: 60 tablet, Rfl: 1   ketoconazole (NIZORAL) 2 % cream, Apply 1 Application topically daily., Disp: 60 g, Rfl: 0   latanoprost (XALATAN) 0.005 % ophthalmic solution, Place 1 drop into both eyes at bedtime., Disp: , Rfl:    levocetirizine (XYZAL) 5 MG tablet, TAKE 1 TABLET (5 MG TOTAL) BY MOUTH 2 (TWO) TIMES DAILY AS NEEDED FOR ALLERGIES (FOR BREAKTHROUGH SYMPTOMS)., Disp: 60 tablet, Rfl: 1   levothyroxine (SYNTHROID) 25 MCG tablet, Take 25 mcg by mouth daily before breakfast., Disp: , Rfl:    levothyroxine (SYNTHROID) 50 MCG tablet, Take 50 mcg by mouth daily before breakfast., Disp: , Rfl:    losartan (COZAAR) 50 MG tablet, Take 50 mg by mouth daily., Disp: , Rfl:    meloxicam (MOBIC) 15 MG tablet, Take 15 mg by mouth daily as needed for pain., Disp: , Rfl:    nitroGLYCERIN (NITROSTAT) 0.4 MG SL tablet, Place 0.4 mg under the tongue every 5 (five) minutes as needed for chest pain., Disp: , Rfl:    omeprazole (PRILOSEC) 20 MG capsule, Take 20 mg by mouth daily., Disp: , Rfl:    oxyCODONE-acetaminophen (PERCOCET) 5-325 MG tablet, Take 1 tablet by mouth every 4 (four) hours as needed for severe pain. (Patient taking differently: Take 0.5 tablets by mouth every 4 (four) hours as needed for  severe pain.), Disp: 30 tablet, Rfl: 0   potassium citrate (UROCIT-K) 10 MEQ (1080 MG) SR tablet, Take 10 mEq by mouth daily., Disp: , Rfl:    triamcinolone (NASACORT) 55 MCG/ACT AERO nasal inhaler, Place 2 sprays into the nose daily., Disp: 16.5 g, Rfl: 2   vitamin B-12 (CYANOCOBALAMIN) 500 MCG tablet, Take 500 mcg by mouth daily., Disp: , Rfl:    Vitamin D, Ergocalciferol, (DRISDOL) 1.25 MG (50000 UNIT) CAPS capsule, Take 50,000 Units by mouth every Friday., Disp: , Rfl:   Social History   Tobacco Use  Smoking Status Never  Smokeless Tobacco Never    Allergies  Allergen Reactions   Cyclobenzaprine     Unknown reaction   Lyrica [Pregabalin]     Unknown reaction   Prednisone     Unknown  reaction    Codeine Itching, Nausea And Vomiting and Rash   Morphine And Related Itching, Nausea And Vomiting and Rash   Objective:   There were no vitals filed for this visit.  There is no height or weight on file to calculate BMI. Constitutional Well developed. Well nourished.  Vascular Foot warm and well perfused. Capillary refill normal to all digits.   Neurologic Normal speech. Oriented to person, place, and time. Epicritic sensation to light touch grossly present bilaterally.  Dermatologic Skin healing well without signs of infection. Skin edges well coapted without signs of infection.  Orthopedic: No tenderness to palpation noted about the surgical site.   Radiographs: Good correction alignment noted reduction of deformity noted.  Some callus formation noted. Assessment:   1. Plantar flexed metatarsal, left     Plan:  Patient was evaluated and treated and all questions answered.  S/p foot surgery left floating osteotomy metatarsal left foot -Clinically healed and officially discharged from my care.  If any foot and ankle issues on future she will come back and see me.   No follow-ups on file.

## 2023-02-25 DIAGNOSIS — G894 Chronic pain syndrome: Secondary | ICD-10-CM | POA: Diagnosis not present

## 2023-02-25 DIAGNOSIS — M5136 Other intervertebral disc degeneration, lumbar region: Secondary | ICD-10-CM | POA: Diagnosis not present

## 2023-02-25 DIAGNOSIS — R200 Anesthesia of skin: Secondary | ICD-10-CM | POA: Diagnosis not present

## 2023-02-25 DIAGNOSIS — M170 Bilateral primary osteoarthritis of knee: Secondary | ICD-10-CM | POA: Diagnosis not present

## 2023-02-25 DIAGNOSIS — G629 Polyneuropathy, unspecified: Secondary | ICD-10-CM | POA: Diagnosis not present

## 2023-02-25 DIAGNOSIS — N183 Chronic kidney disease, stage 3 unspecified: Secondary | ICD-10-CM | POA: Diagnosis not present

## 2023-02-25 DIAGNOSIS — I129 Hypertensive chronic kidney disease with stage 1 through stage 4 chronic kidney disease, or unspecified chronic kidney disease: Secondary | ICD-10-CM | POA: Diagnosis not present

## 2023-02-25 DIAGNOSIS — D0512 Intraductal carcinoma in situ of left breast: Secondary | ICD-10-CM | POA: Diagnosis not present

## 2023-02-25 DIAGNOSIS — M25511 Pain in right shoulder: Secondary | ICD-10-CM | POA: Diagnosis not present

## 2023-02-27 DIAGNOSIS — M25511 Pain in right shoulder: Secondary | ICD-10-CM | POA: Diagnosis not present

## 2023-02-27 DIAGNOSIS — D0512 Intraductal carcinoma in situ of left breast: Secondary | ICD-10-CM | POA: Diagnosis not present

## 2023-02-27 DIAGNOSIS — G629 Polyneuropathy, unspecified: Secondary | ICD-10-CM | POA: Diagnosis not present

## 2023-02-27 DIAGNOSIS — G894 Chronic pain syndrome: Secondary | ICD-10-CM | POA: Diagnosis not present

## 2023-02-27 DIAGNOSIS — M5136 Other intervertebral disc degeneration, lumbar region: Secondary | ICD-10-CM | POA: Diagnosis not present

## 2023-02-27 DIAGNOSIS — N183 Chronic kidney disease, stage 3 unspecified: Secondary | ICD-10-CM | POA: Diagnosis not present

## 2023-02-27 DIAGNOSIS — R200 Anesthesia of skin: Secondary | ICD-10-CM | POA: Diagnosis not present

## 2023-02-27 DIAGNOSIS — I129 Hypertensive chronic kidney disease with stage 1 through stage 4 chronic kidney disease, or unspecified chronic kidney disease: Secondary | ICD-10-CM | POA: Diagnosis not present

## 2023-02-27 DIAGNOSIS — M170 Bilateral primary osteoarthritis of knee: Secondary | ICD-10-CM | POA: Diagnosis not present

## 2023-03-03 DIAGNOSIS — M5136 Other intervertebral disc degeneration, lumbar region: Secondary | ICD-10-CM | POA: Diagnosis not present

## 2023-03-03 DIAGNOSIS — M25511 Pain in right shoulder: Secondary | ICD-10-CM | POA: Diagnosis not present

## 2023-03-03 DIAGNOSIS — N183 Chronic kidney disease, stage 3 unspecified: Secondary | ICD-10-CM | POA: Diagnosis not present

## 2023-03-03 DIAGNOSIS — M170 Bilateral primary osteoarthritis of knee: Secondary | ICD-10-CM | POA: Diagnosis not present

## 2023-03-03 DIAGNOSIS — G629 Polyneuropathy, unspecified: Secondary | ICD-10-CM | POA: Diagnosis not present

## 2023-03-03 DIAGNOSIS — R200 Anesthesia of skin: Secondary | ICD-10-CM | POA: Diagnosis not present

## 2023-03-03 DIAGNOSIS — I129 Hypertensive chronic kidney disease with stage 1 through stage 4 chronic kidney disease, or unspecified chronic kidney disease: Secondary | ICD-10-CM | POA: Diagnosis not present

## 2023-03-03 DIAGNOSIS — D0512 Intraductal carcinoma in situ of left breast: Secondary | ICD-10-CM | POA: Diagnosis not present

## 2023-03-03 DIAGNOSIS — G894 Chronic pain syndrome: Secondary | ICD-10-CM | POA: Diagnosis not present

## 2023-03-08 DIAGNOSIS — I129 Hypertensive chronic kidney disease with stage 1 through stage 4 chronic kidney disease, or unspecified chronic kidney disease: Secondary | ICD-10-CM | POA: Diagnosis not present

## 2023-03-08 DIAGNOSIS — N183 Chronic kidney disease, stage 3 unspecified: Secondary | ICD-10-CM | POA: Diagnosis not present

## 2023-03-08 DIAGNOSIS — M170 Bilateral primary osteoarthritis of knee: Secondary | ICD-10-CM | POA: Diagnosis not present

## 2023-03-08 DIAGNOSIS — M5136 Other intervertebral disc degeneration, lumbar region: Secondary | ICD-10-CM | POA: Diagnosis not present

## 2023-03-08 DIAGNOSIS — G629 Polyneuropathy, unspecified: Secondary | ICD-10-CM | POA: Diagnosis not present

## 2023-03-08 DIAGNOSIS — G894 Chronic pain syndrome: Secondary | ICD-10-CM | POA: Diagnosis not present

## 2023-03-08 DIAGNOSIS — R200 Anesthesia of skin: Secondary | ICD-10-CM | POA: Diagnosis not present

## 2023-03-08 DIAGNOSIS — D0512 Intraductal carcinoma in situ of left breast: Secondary | ICD-10-CM | POA: Diagnosis not present

## 2023-03-08 DIAGNOSIS — M25511 Pain in right shoulder: Secondary | ICD-10-CM | POA: Diagnosis not present

## 2023-03-17 DIAGNOSIS — C50912 Malignant neoplasm of unspecified site of left female breast: Secondary | ICD-10-CM | POA: Diagnosis not present

## 2023-03-27 DIAGNOSIS — H4053X3 Glaucoma secondary to other eye disorders, bilateral, severe stage: Secondary | ICD-10-CM | POA: Diagnosis not present

## 2023-03-31 DIAGNOSIS — C50912 Malignant neoplasm of unspecified site of left female breast: Secondary | ICD-10-CM | POA: Diagnosis not present

## 2023-04-03 DIAGNOSIS — R509 Fever, unspecified: Secondary | ICD-10-CM | POA: Diagnosis not present

## 2023-04-03 DIAGNOSIS — U071 COVID-19: Secondary | ICD-10-CM | POA: Diagnosis not present

## 2023-04-15 ENCOUNTER — Ambulatory Visit: Payer: Medicare HMO | Admitting: Hematology and Oncology

## 2023-04-15 ENCOUNTER — Encounter: Payer: Self-pay | Admitting: Adult Health

## 2023-04-15 ENCOUNTER — Other Ambulatory Visit: Payer: Self-pay | Admitting: *Deleted

## 2023-04-15 ENCOUNTER — Other Ambulatory Visit: Payer: Self-pay

## 2023-04-15 ENCOUNTER — Inpatient Hospital Stay: Payer: Medicare HMO | Attending: Hematology and Oncology | Admitting: Adult Health

## 2023-04-15 ENCOUNTER — Inpatient Hospital Stay: Payer: Medicare HMO

## 2023-04-15 VITALS — BP 177/52 | HR 63 | Temp 97.9°F | Resp 17 | Wt 124.4 lb

## 2023-04-15 DIAGNOSIS — Z170 Estrogen receptor positive status [ER+]: Secondary | ICD-10-CM | POA: Diagnosis not present

## 2023-04-15 DIAGNOSIS — I471 Supraventricular tachycardia, unspecified: Secondary | ICD-10-CM | POA: Insufficient documentation

## 2023-04-15 DIAGNOSIS — M170 Bilateral primary osteoarthritis of knee: Secondary | ICD-10-CM | POA: Insufficient documentation

## 2023-04-15 DIAGNOSIS — M810 Age-related osteoporosis without current pathological fracture: Secondary | ICD-10-CM | POA: Insufficient documentation

## 2023-04-15 DIAGNOSIS — C50912 Malignant neoplasm of unspecified site of left female breast: Secondary | ICD-10-CM

## 2023-04-15 DIAGNOSIS — Z79811 Long term (current) use of aromatase inhibitors: Secondary | ICD-10-CM | POA: Diagnosis not present

## 2023-04-15 DIAGNOSIS — D0512 Intraductal carcinoma in situ of left breast: Secondary | ICD-10-CM | POA: Diagnosis not present

## 2023-04-15 DIAGNOSIS — Z9012 Acquired absence of left breast and nipple: Secondary | ICD-10-CM | POA: Diagnosis not present

## 2023-04-15 DIAGNOSIS — E7800 Pure hypercholesterolemia, unspecified: Secondary | ICD-10-CM | POA: Insufficient documentation

## 2023-04-15 DIAGNOSIS — M5136 Other intervertebral disc degeneration, lumbar region: Secondary | ICD-10-CM | POA: Insufficient documentation

## 2023-04-15 LAB — CBC WITH DIFFERENTIAL (CANCER CENTER ONLY)
Abs Immature Granulocytes: 0.02 10*3/uL (ref 0.00–0.07)
Basophils Absolute: 0 10*3/uL (ref 0.0–0.1)
Basophils Relative: 1 %
Eosinophils Absolute: 0.1 10*3/uL (ref 0.0–0.5)
Eosinophils Relative: 1 %
HCT: 39.5 % (ref 36.0–46.0)
Hemoglobin: 13.3 g/dL (ref 12.0–15.0)
Immature Granulocytes: 0 %
Lymphocytes Relative: 30 %
Lymphs Abs: 2.3 10*3/uL (ref 0.7–4.0)
MCH: 31.4 pg (ref 26.0–34.0)
MCHC: 33.7 g/dL (ref 30.0–36.0)
MCV: 93.4 fL (ref 80.0–100.0)
Monocytes Absolute: 0.8 10*3/uL (ref 0.1–1.0)
Monocytes Relative: 10 %
Neutro Abs: 4.4 10*3/uL (ref 1.7–7.7)
Neutrophils Relative %: 58 %
Platelet Count: 251 10*3/uL (ref 150–400)
RBC: 4.23 MIL/uL (ref 3.87–5.11)
RDW: 12.7 % (ref 11.5–15.5)
WBC Count: 7.6 10*3/uL (ref 4.0–10.5)
nRBC: 0 % (ref 0.0–0.2)

## 2023-04-15 LAB — CMP (CANCER CENTER ONLY)
ALT: 13 U/L (ref 0–44)
AST: 18 U/L (ref 15–41)
Albumin: 4 g/dL (ref 3.5–5.0)
Alkaline Phosphatase: 64 U/L (ref 38–126)
Anion gap: 7 (ref 5–15)
BUN: 26 mg/dL — ABNORMAL HIGH (ref 8–23)
CO2: 26 mmol/L (ref 22–32)
Calcium: 10.1 mg/dL (ref 8.9–10.3)
Chloride: 107 mmol/L (ref 98–111)
Creatinine: 1.01 mg/dL — ABNORMAL HIGH (ref 0.44–1.00)
GFR, Estimated: 56 mL/min — ABNORMAL LOW (ref >60)
Glucose, Bld: 81 mg/dL (ref 70–99)
Potassium: 4.1 mmol/L (ref 3.5–5.1)
Sodium: 140 mmol/L (ref 135–145)
Total Bilirubin: 0.3 mg/dL (ref 0.3–1.2)
Total Protein: 6.8 g/dL (ref 6.5–8.1)

## 2023-04-15 MED ORDER — ANASTROZOLE 1 MG PO TABS: 1.0000 mg | ORAL_TABLET | Freq: Every day | ORAL | 3 refills | Status: DC

## 2023-04-15 NOTE — Progress Notes (Unsigned)
SURVIVORSHIP VISIT:  BRIEF ONCOLOGIC HISTORY:  Oncology History  Invasive ductal carcinoma of breast, female, left (HCC)  01/08/2023 Initial Diagnosis   Invasive ductal carcinoma of breast, female, left (HCC)   01/08/2023 Surgery   A. BREAST, LEFT, MASTECTOMY:  Ductal carcinoma in situ, solid and cribriform types, nuclear grade 2,  with necrosis  DCIS greatest dimension: 4 mm  Margins:  Negative            Closest margin: Superior margin (8 mm)  Prognostic markers: Performed on prior biopsy [id number]    01/2023 -  Anti-estrogen oral therapy   Anastrozole x 5 years     INTERVAL HISTORY:  Jessica Ayers to review her survivorship care plan detailing her treatment course for breast cancer, as well as monitoring long-term side effects of that treatment, education regarding health maintenance, screening, and overall wellness and health promotion.     Overall, Jessica Ayers reports feeling quite well   REVIEW OF SYSTEMS:  Review of Systems - Oncology Breast: Denies any new nodularity, masses, tenderness, nipple changes, or nipple discharge.       PAST MEDICAL/SURGICAL HISTORY:  Past Medical History:  Diagnosis Date   Asthma    Benign essential hypertension    Esophageal reflux    Fibromyalgia    Hypothyroidism    Osteoarthritis    Osteopenia    Vitamin D deficiency    Past Surgical History:  Procedure Laterality Date   APPENDECTOMY     BREAST BIOPSY Left 12/06/2022   MM LT BREAST BX W LOC DEV EA AD LESION IMG BX SPEC STEREO GUIDE 12/06/2022 [facility]   BREAST BIOPSY Left 12/06/2022   MM LT BREAST BX W LOC DEV 1ST LESION IMAGE BX SPEC STEREO GUIDE 12/06/2022 [facility]   EYE SURGERY     METATARSAL OSTEOTOMY Left 12/30/2022   4TH LEFT   TOTAL MASTECTOMY Left 01/08/2023   Procedure: LEFT TOTAL MASTECTOMY;  Surgeon: Manus Rudd, MD;  Location: [facility] OR;  Service: General;  Laterality: Left;   TUBAL LIGATION       ALLERGIES:  Allergies  Allergen Reactions    Cyclobenzaprine     Unknown reaction   Lyrica [Pregabalin]     Unknown reaction   Prednisone     Unknown reaction    Codeine Itching, Nausea And Vomiting and Rash   Morphine And Codeine Itching, Nausea And Vomiting and Rash     CURRENT MEDICATIONS:  Outpatient Encounter Medications as of 04/15/2023  Medication Sig Note   albuterol (VENTOLIN HFA) 108 (90 Base) MCG/ACT inhaler Inhale 1-2 puffs into the lungs every 4 (four) hours as needed for wheezing.    amLODipine (NORVASC) 10 MG tablet Take 10 mg by mouth daily. (Patient not taking: Reported on 01/21/2023)    anastrozole (ARIMIDEX) 1 MG tablet Take 1 tablet (1 mg total) by mouth daily.    aspirin EC 81 MG tablet Take 81 mg by mouth daily. Swallow whole. 01/01/2023: On hold for procedure    clotrimazole-betamethasone (LOTRISONE) cream APPLY TO AFFECTED AREA TWICE DAILY (Patient taking differently: Apply 1 Application topically 2 (two) times daily.)    dorzolamide-timolol (COSOPT) 2-0.5 % ophthalmic solution Place 1 drop into both eyes 2 (two) times daily.    furosemide (LASIX) 20 MG tablet Take 20 mg by mouth daily as needed for edema.    gabapentin (NEURONTIN) 100 MG capsule Take 100 mg by mouth 3 (three) times daily as needed (pain).    ibuprofen (ADVIL) 800 MG tablet Take 1  tablet (800 mg total) by mouth every 6 (six) hours as needed. (Patient taking differently: Take 400 mg by mouth every 6 (six) hours as needed.)    ketoconazole (NIZORAL) 2 % cream Apply 1 Application topically daily.    latanoprost (XALATAN) 0.005 % ophthalmic solution Place 1 drop into both eyes at bedtime.    levocetirizine (XYZAL) 5 MG tablet TAKE 1 TABLET (5 MG TOTAL) BY MOUTH 2 (TWO) TIMES DAILY AS NEEDED FOR ALLERGIES (FOR BREAKTHROUGH SYMPTOMS). 01/01/2023: Pt ran out, needs to get more   levothyroxine (SYNTHROID) 25 MCG tablet Take 25 mcg by mouth daily before breakfast. 01/01/2023: On hold only if her level get lower .   levothyroxine (SYNTHROID) 50 MCG tablet  Take 50 mcg by mouth daily before breakfast.    losartan (COZAAR) 50 MG tablet Take 50 mg by mouth daily.    meloxicam (MOBIC) 15 MG tablet Take 15 mg by mouth daily as needed for pain.    nitroGLYCERIN (NITROSTAT) 0.4 MG SL tablet Place 0.4 mg under the tongue every 5 (five) minutes as needed for chest pain.    omeprazole (PRILOSEC) 20 MG capsule Take 20 mg by mouth daily.    oxyCODONE-acetaminophen (PERCOCET) 5-325 MG tablet Take 1 tablet by mouth every 4 (four) hours as needed for severe pain. (Patient taking differently: Take 0.5 tablets by mouth every 4 (four) hours as needed for severe pain.)    potassium citrate (UROCIT-K) 10 MEQ (1080 MG) SR tablet Take 10 mEq by mouth daily. 01/01/2023: On hold    triamcinolone (NASACORT) 55 MCG/ACT AERO nasal inhaler Place 2 sprays into the nose daily.    vitamin B-12 (CYANOCOBALAMIN) 500 MCG tablet Take 500 mcg by mouth daily. 01/01/2023: On hold for procedure    Vitamin D, Ergocalciferol, (DRISDOL) 1.25 MG (50000 UNIT) CAPS capsule Take 50,000 Units by mouth every Friday.    No facility-administered encounter medications on file as of 04/15/2023.     ONCOLOGIC FAMILY HISTORY:  Family History  Problem Relation Age of Onset   CAD Father    Breast cancer Neg Hx      SOCIAL HISTORY:  Social History   Socioeconomic History   Marital status: Married    Spouse name: Not on file   Number of children: Not on file   Years of education: Not on file   Highest education level: Not on file  Occupational History   Not on file  Tobacco Use   Smoking status: Never   Smokeless tobacco: Never  Vaping Use   Vaping Use: Never used  Substance and Sexual Activity   Alcohol use: Not Currently   Drug use: Never   Sexual activity: Not on file  Other Topics Concern   Not on file  Social History Narrative   Not on file   Social Determinants of Health   Financial Resource Strain: Not on file  Food Insecurity: No Food Insecurity (01/09/2023)   Hunger  Vital Sign    Worried About Running Out of Food in the Last Year: Never true    Ran Out of Food in the Last Year: Never true  Transportation Needs: No Transportation Needs (01/09/2023)   PRAPARE - Administrator, Civil Service (Medical): No    Lack of Transportation (Non-Medical): No  Physical Activity: Not on file  Stress: Not on file  Social Connections: Not on file  Intimate Partner Violence: Not At Risk (01/09/2023)   Humiliation, Afraid, Rape, and Kick questionnaire    Fear  of Current or Ex-Partner: No    Emotionally Abused: No    Physically Abused: No    Sexually Abused: No     OBSERVATIONS/OBJECTIVE:  BP (!) 177/52 (BP Location: Right Arm, Patient Position: Sitting) Comment: CMA notified verbally  Pulse 63   Temp 97.9 F (36.6 C) (Temporal)   Resp 17   Wt 124 lb 6.4 oz (56.4 kg)   SpO2 100%   BMI 23.51 kg/m  GENERAL: Patient is a well appearing female in no acute distress HEENT:  Sclerae anicteric.  Oropharynx clear and moist. No ulcerations or evidence of oropharyngeal candidiasis. Neck is supple.  NODES:  No cervical, supraclavicular, or axillary lymphadenopathy palpated.  BREAST EXAM:  Deferred. LUNGS:  Clear to auscultation bilaterally.  No wheezes or rhonchi. HEART:  Regular rate and rhythm. No murmur appreciated. ABDOMEN:  Soft, nontender.  Positive, normoactive bowel sounds. No organomegaly palpated. MSK:  No focal spinal tenderness to palpation. Full range of motion bilaterally in the upper extremities. EXTREMITIES:  No peripheral edema.   SKIN:  Clear with no obvious rashes or skin changes. No nail dyscrasia. NEURO:  Nonfocal. Well oriented.  Appropriate affect.   LABORATORY DATA:  None for this visit.  DIAGNOSTIC IMAGING:  None for this visit.      ASSESSMENT AND PLAN:  Ms.. Jessica Ayers is a pleasant 82 y.o. female with Stage *** right/left breast invasive ductal carcinoma, ER+/PR+/HER2-, diagnosed in ***, treated with lumpectomy, adjuvant  radiation therapy, and anti-estrogen therapy with *** beginning in ***.  She presents to the Survivorship [facility] for our initial meeting and routine follow-up post-completion of treatment for breast cancer.    1. Stage *** right/left breast cancer:  Ms. Tomasi is continuing to recover from definitive treatment for breast cancer. She will follow-up with her medical oncologist, Dr.  Marland Kitchen with history and physical exam per surveillance protocol.  She will continue her anti-estrogen therapy with ***. Thus far, she is tolerating the *** well, with minimal side effects. Her mammogram is due ***; orders placed today.   Today, a comprehensive survivorship care plan and treatment summary was reviewed with the patient today detailing her breast cancer diagnosis, treatment course, potential late/long-term effects of treatment, appropriate follow-up care with recommendations for the future, and patient education resources.  A copy of this summary, along with a letter will be sent to the patient's primary care provider via mail/fax/In Basket message after today's visit.    #. Problem(s) at Visit______________  #. Bone health:  Given Ms. Rossano's age/history of breast cancer and her current treatment regimen including anti-estrogen therapy with ***, she is at risk for bone demineralization.  Her last DEXA scan was ***, which showed ***.  In the meantime, she was encouraged to increase her consumption of foods rich in calcium, as well as increase her weight-bearing activities.  She was given education on specific activities to promote bone health.  #. Cancer screening:  Due to Ms. Stocking's history and her age, she should receive screening for skin cancers, colon cancer, and gynecologic cancers.  The information and recommendations are listed on the patient's comprehensive care plan/treatment summary and were reviewed in detail with the patient.    #. Health maintenance and wellness promotion: Ms. Manjarrez was encouraged to  consume 5-7 servings of fruits and vegetables per day. We reviewed the "Nutrition Rainbow" handout.  She was also encouraged to engage in moderate to vigorous exercise for 30 minutes per day most days of the week.  She was instructed  to limit her alcohol consumption and continue to abstain from tobacco use/***was encouraged stop smoking.     #. Support services/counseling: It is not uncommon for this period of the patient's cancer care trajectory to be one of many emotions and stressors.   She was given information regarding our available services and encouraged to contact me with any questions or for help enrolling in any of our support group/programs.    Follow up instructions:    -Return to cancer center in 6 months for f/u with Dr. Al Pimple  -Mammogram due in 05/2023 -Bone density testing due in 07/2023 -She is welcome to return back to the Survivorship Clinic at any time; no additional follow-up needed at this time.  -Consider referral back to survivorship as a long-term survivor for continued surveillance  The patient was provided an opportunity to ask questions and all were answered. The patient agreed with the plan and demonstrated an understanding of the instructions.   Total encounter time:*** minutes*in face-to-face visit time, chart review, lab review, care coordination, order entry, and documentation of the encounter time.    Lillard Anes, NP 04/15/23 3:20 PM Medical Oncology and Hematology [facility] [address] [place], [place] [address] Tel. [phone]    Fax. [phone]  *Total Encounter Time as defined by the Centers for Medicare and Medicaid Services includes, in addition to the face-to-face time of a patient visit (documented in the note above) non-face-to-face time: obtaining and reviewing outside history, ordering and reviewing medications, tests or procedures, care coordination (communications with other health care professionals or caregivers) and  documentation in the medical record.

## 2023-04-25 ENCOUNTER — Other Ambulatory Visit: Payer: Self-pay

## 2023-04-25 ENCOUNTER — Telehealth: Payer: Self-pay

## 2023-04-25 MED ORDER — ANASTROZOLE 1 MG PO TABS: 1.0000 mg | ORAL_TABLET | Freq: Every day | ORAL | 0 refills | Status: DC

## 2023-04-25 NOTE — Telephone Encounter (Signed)
Received call from pt's son stating pt dropped her bottle of Anastrozole down the sink and they fell into the drain. He is asking for a short fill to be sent to [facility] in [place]. Per NP, ok to refill short fill. Pt's son verbalized thanks and understanding.

## 2023-05-06 DIAGNOSIS — D0512 Intraductal carcinoma in situ of left breast: Secondary | ICD-10-CM | POA: Diagnosis not present

## 2023-06-03 DIAGNOSIS — K219 Gastro-esophageal reflux disease without esophagitis: Secondary | ICD-10-CM | POA: Diagnosis not present

## 2023-06-03 DIAGNOSIS — I10 Essential (primary) hypertension: Secondary | ICD-10-CM | POA: Diagnosis not present

## 2023-06-03 DIAGNOSIS — N183 Chronic kidney disease, stage 3 unspecified: Secondary | ICD-10-CM | POA: Diagnosis not present

## 2023-06-03 DIAGNOSIS — E038 Other specified hypothyroidism: Secondary | ICD-10-CM | POA: Diagnosis not present

## 2023-06-03 DIAGNOSIS — R600 Localized edema: Secondary | ICD-10-CM | POA: Diagnosis not present

## 2023-06-03 DIAGNOSIS — D0512 Intraductal carcinoma in situ of left breast: Secondary | ICD-10-CM | POA: Diagnosis not present

## 2023-06-03 DIAGNOSIS — M5136 Other intervertebral disc degeneration, lumbar region: Secondary | ICD-10-CM | POA: Diagnosis not present

## 2023-06-03 DIAGNOSIS — I471 Supraventricular tachycardia, unspecified: Secondary | ICD-10-CM | POA: Diagnosis not present

## 2023-06-03 DIAGNOSIS — M810 Age-related osteoporosis without current pathological fracture: Secondary | ICD-10-CM | POA: Diagnosis not present

## 2023-06-19 DIAGNOSIS — Z1231 Encounter for screening mammogram for malignant neoplasm of breast: Secondary | ICD-10-CM | POA: Diagnosis not present

## 2023-07-15 DIAGNOSIS — M542 Cervicalgia: Secondary | ICD-10-CM | POA: Diagnosis not present

## 2023-07-15 DIAGNOSIS — M9901 Segmental and somatic dysfunction of cervical region: Secondary | ICD-10-CM | POA: Diagnosis not present

## 2023-07-15 DIAGNOSIS — M9903 Segmental and somatic dysfunction of lumbar region: Secondary | ICD-10-CM | POA: Diagnosis not present

## 2023-07-15 DIAGNOSIS — M545 Low back pain, unspecified: Secondary | ICD-10-CM | POA: Diagnosis not present

## 2023-07-15 DIAGNOSIS — M9902 Segmental and somatic dysfunction of thoracic region: Secondary | ICD-10-CM | POA: Diagnosis not present

## 2023-07-15 DIAGNOSIS — M62830 Muscle spasm of back: Secondary | ICD-10-CM | POA: Diagnosis not present

## 2023-07-15 DIAGNOSIS — M62838 Other muscle spasm: Secondary | ICD-10-CM | POA: Diagnosis not present

## 2023-07-15 DIAGNOSIS — M546 Pain in thoracic spine: Secondary | ICD-10-CM | POA: Diagnosis not present

## 2023-07-29 ENCOUNTER — Other Ambulatory Visit: Payer: Medicare HMO

## 2023-08-04 DIAGNOSIS — H4053X3 Glaucoma secondary to other eye disorders, bilateral, severe stage: Secondary | ICD-10-CM | POA: Diagnosis not present

## 2023-08-18 ENCOUNTER — Other Ambulatory Visit: Payer: Self-pay

## 2023-08-18 MED ORDER — IBUPROFEN 800 MG PO TABS: 800.0000 mg | ORAL_TABLET | Freq: Four times a day (QID) | ORAL | 0 refills | Status: AC | PRN

## 2023-08-21 DIAGNOSIS — I10 Essential (primary) hypertension: Secondary | ICD-10-CM | POA: Diagnosis not present

## 2023-09-15 DIAGNOSIS — H6501 Acute serous otitis media, right ear: Secondary | ICD-10-CM | POA: Diagnosis not present

## 2023-12-06 ENCOUNTER — Other Ambulatory Visit: Payer: Self-pay | Admitting: Podiatry

## 2023-12-11 DIAGNOSIS — E038 Other specified hypothyroidism: Secondary | ICD-10-CM | POA: Diagnosis not present

## 2023-12-11 DIAGNOSIS — M51362 Other intervertebral disc degeneration, lumbar region with discogenic back pain and lower extremity pain: Secondary | ICD-10-CM | POA: Diagnosis not present

## 2023-12-11 DIAGNOSIS — G629 Polyneuropathy, unspecified: Secondary | ICD-10-CM | POA: Diagnosis not present

## 2023-12-11 DIAGNOSIS — M810 Age-related osteoporosis without current pathological fracture: Secondary | ICD-10-CM | POA: Diagnosis not present

## 2023-12-11 DIAGNOSIS — I10 Essential (primary) hypertension: Secondary | ICD-10-CM | POA: Diagnosis not present

## 2023-12-11 DIAGNOSIS — E538 Deficiency of other specified B group vitamins: Secondary | ICD-10-CM | POA: Diagnosis not present

## 2023-12-11 DIAGNOSIS — E7800 Pure hypercholesterolemia, unspecified: Secondary | ICD-10-CM | POA: Diagnosis not present

## 2023-12-11 DIAGNOSIS — N183 Chronic kidney disease, stage 3 unspecified: Secondary | ICD-10-CM | POA: Diagnosis not present

## 2023-12-11 DIAGNOSIS — Z0000 Encounter for general adult medical examination without abnormal findings: Secondary | ICD-10-CM | POA: Diagnosis not present

## 2023-12-11 DIAGNOSIS — D0512 Intraductal carcinoma in situ of left breast: Secondary | ICD-10-CM | POA: Diagnosis not present

## 2023-12-18 DIAGNOSIS — H4053X3 Glaucoma secondary to other eye disorders, bilateral, severe stage: Secondary | ICD-10-CM | POA: Diagnosis not present

## 2023-12-30 ENCOUNTER — Other Ambulatory Visit: Payer: Self-pay | Admitting: Hematology and Oncology

## 2023-12-30 DIAGNOSIS — C50912 Malignant neoplasm of unspecified site of left female breast: Secondary | ICD-10-CM

## 2024-01-02 DIAGNOSIS — L299 Pruritus, unspecified: Secondary | ICD-10-CM | POA: Diagnosis not present

## 2024-01-02 DIAGNOSIS — H9201 Otalgia, right ear: Secondary | ICD-10-CM | POA: Diagnosis not present

## 2024-01-26 ENCOUNTER — Ambulatory Visit (INDEPENDENT_AMBULATORY_CARE_PROVIDER_SITE_OTHER): Payer: Medicare HMO

## 2024-02-03 DIAGNOSIS — C50912 Malignant neoplasm of unspecified site of left female breast: Secondary | ICD-10-CM | POA: Diagnosis not present

## 2024-03-07 ENCOUNTER — Other Ambulatory Visit: Payer: Self-pay | Admitting: Adult Health

## 2024-03-11 DIAGNOSIS — I129 Hypertensive chronic kidney disease with stage 1 through stage 4 chronic kidney disease, or unspecified chronic kidney disease: Secondary | ICD-10-CM | POA: Diagnosis not present

## 2024-03-11 DIAGNOSIS — N183 Chronic kidney disease, stage 3 unspecified: Secondary | ICD-10-CM | POA: Diagnosis not present

## 2024-03-11 DIAGNOSIS — I10 Essential (primary) hypertension: Secondary | ICD-10-CM | POA: Diagnosis not present

## 2024-03-13 DIAGNOSIS — M170 Bilateral primary osteoarthritis of knee: Secondary | ICD-10-CM | POA: Diagnosis not present

## 2024-03-13 DIAGNOSIS — N183 Chronic kidney disease, stage 3 unspecified: Secondary | ICD-10-CM | POA: Diagnosis not present

## 2024-03-13 DIAGNOSIS — M1990 Unspecified osteoarthritis, unspecified site: Secondary | ICD-10-CM | POA: Diagnosis not present

## 2024-03-13 DIAGNOSIS — I129 Hypertensive chronic kidney disease with stage 1 through stage 4 chronic kidney disease, or unspecified chronic kidney disease: Secondary | ICD-10-CM | POA: Diagnosis not present

## 2024-03-13 DIAGNOSIS — E038 Other specified hypothyroidism: Secondary | ICD-10-CM | POA: Diagnosis not present

## 2024-03-13 DIAGNOSIS — D0512 Intraductal carcinoma in situ of left breast: Secondary | ICD-10-CM | POA: Diagnosis not present

## 2024-03-13 DIAGNOSIS — I10 Essential (primary) hypertension: Secondary | ICD-10-CM | POA: Diagnosis not present

## 2024-03-15 DIAGNOSIS — G8929 Other chronic pain: Secondary | ICD-10-CM | POA: Diagnosis not present

## 2024-03-15 DIAGNOSIS — M5441 Lumbago with sciatica, right side: Secondary | ICD-10-CM | POA: Diagnosis not present

## 2024-03-15 DIAGNOSIS — M5442 Lumbago with sciatica, left side: Secondary | ICD-10-CM | POA: Diagnosis not present

## 2024-03-15 DIAGNOSIS — M170 Bilateral primary osteoarthritis of knee: Secondary | ICD-10-CM | POA: Diagnosis not present

## 2024-03-17 DIAGNOSIS — C50912 Malignant neoplasm of unspecified site of left female breast: Secondary | ICD-10-CM | POA: Diagnosis not present

## 2024-03-22 DIAGNOSIS — M533 Sacrococcygeal disorders, not elsewhere classified: Secondary | ICD-10-CM | POA: Diagnosis not present

## 2024-03-31 DIAGNOSIS — M533 Sacrococcygeal disorders, not elsewhere classified: Secondary | ICD-10-CM | POA: Diagnosis not present

## 2024-04-07 IMAGING — CT CT HEAD W/O CM
4 series · 16 of 47 positions shown, 18 images · non-contrast
Comparison: None Available.

CLINICAL DATA: Head injury



[Series 2: head 5.00 hr40 s3 axial ibhc · axial · 0.43mm/px · z∈[-661,-541]mm · 7 of 32 slices shown, 9 images]
[im 4/32  brain]
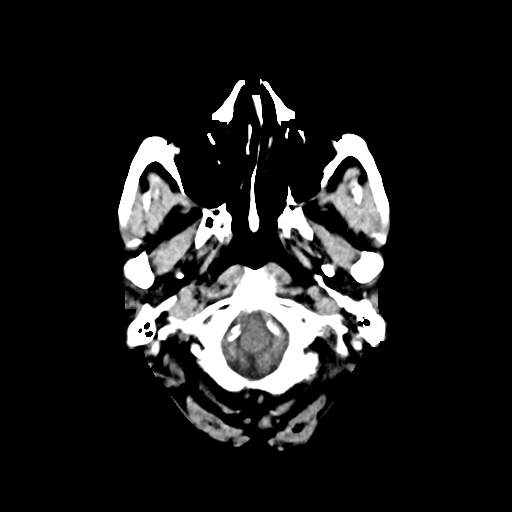
[im 4/32  bone]
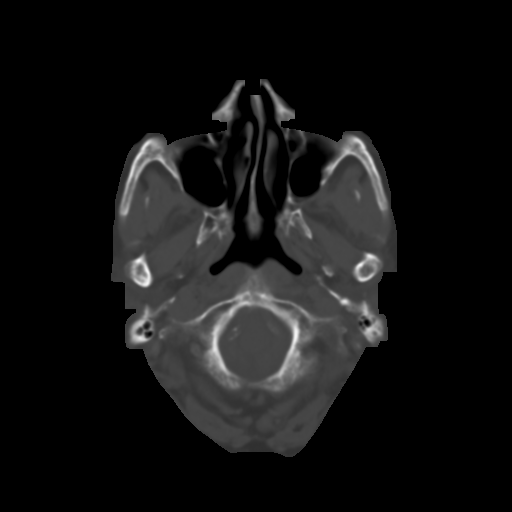
[im 8/32  brain]
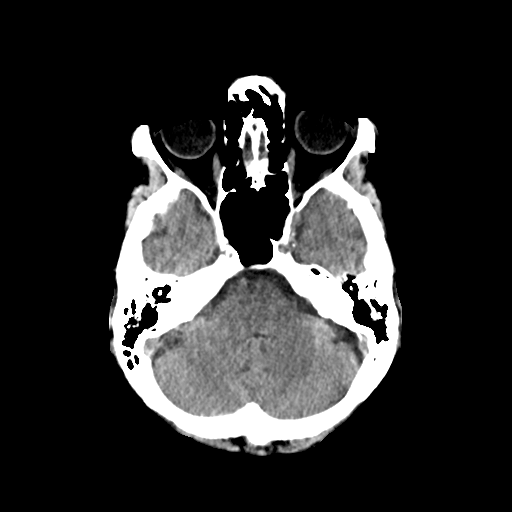
[im 12/32  brain]
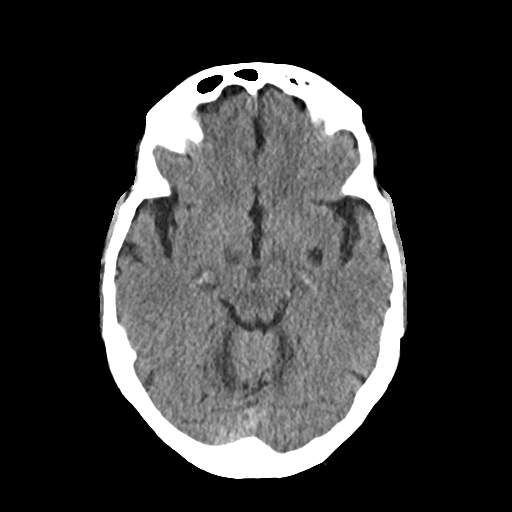
[im 16/32  brain]
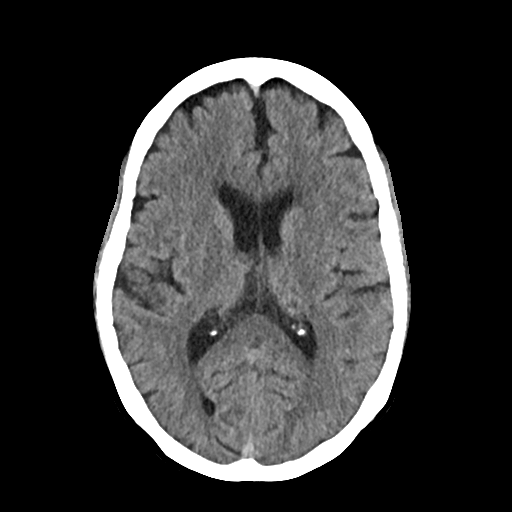
[im 20/32  brain]
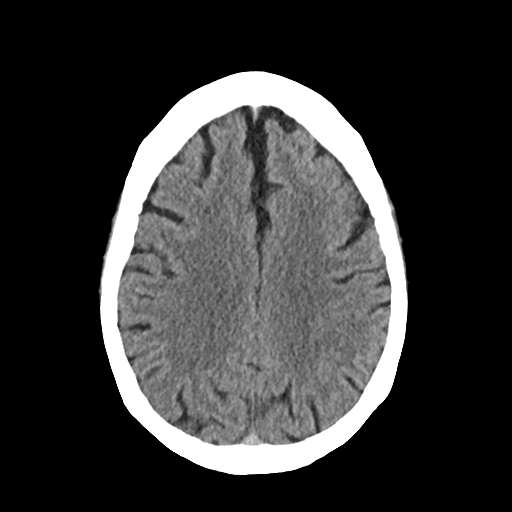
[im 20/32  bone]
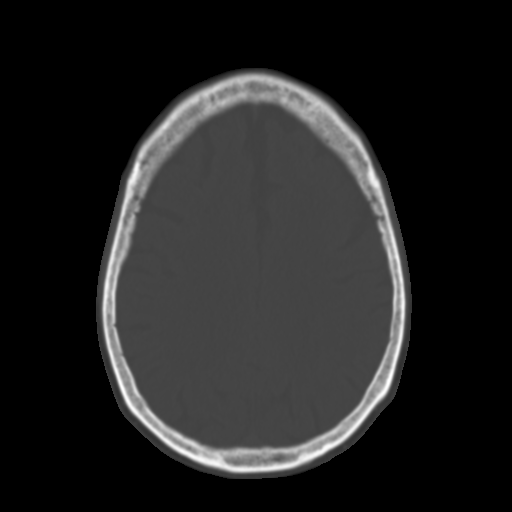
[im 24/32  brain]
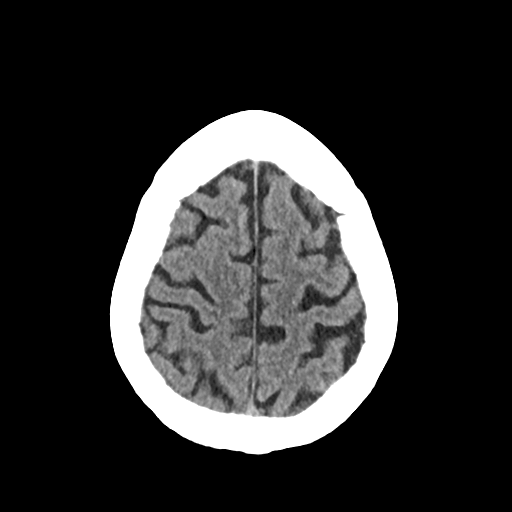
[im 28/32  brain]
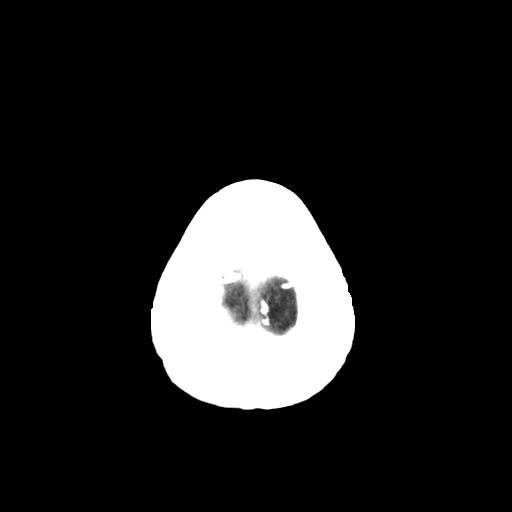

[Series 3: head 2.00 hr60 s3 axial bone · axial · 0.46mm/px · z∈[-663,-631]mm · 3 of 80 slices shown]
[im 8/80  bone]
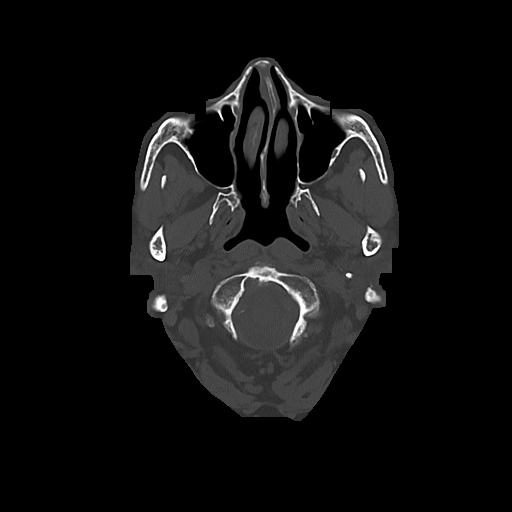
[im 16/80  bone]
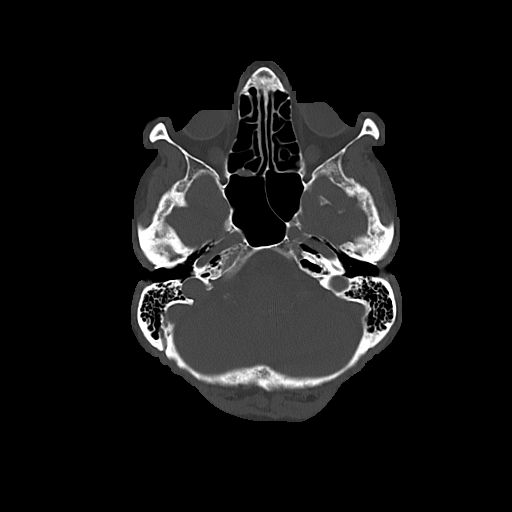
[im 24/80  bone]
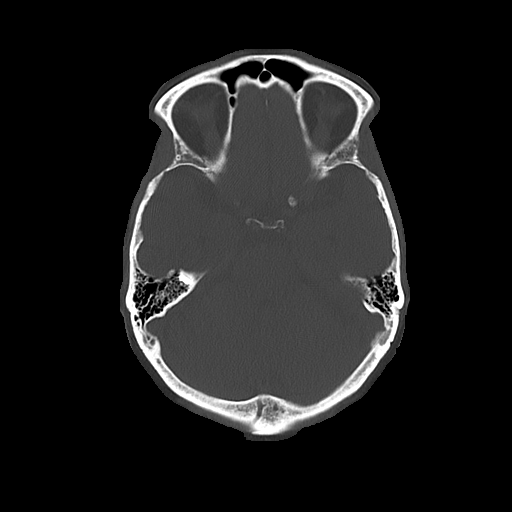

[Series 4: head 3.00 hr40 s3 sag · sagittal · 0.32mm/px · 3 of 56 slices shown]
[im 19/56  brain]
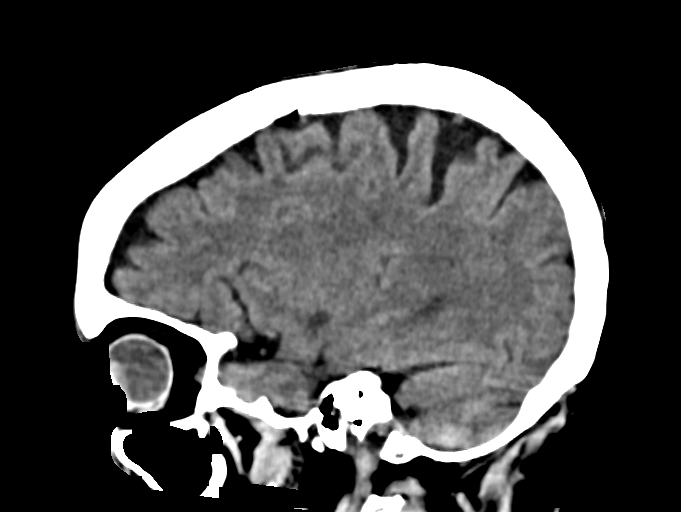
[im 28/56  brain]
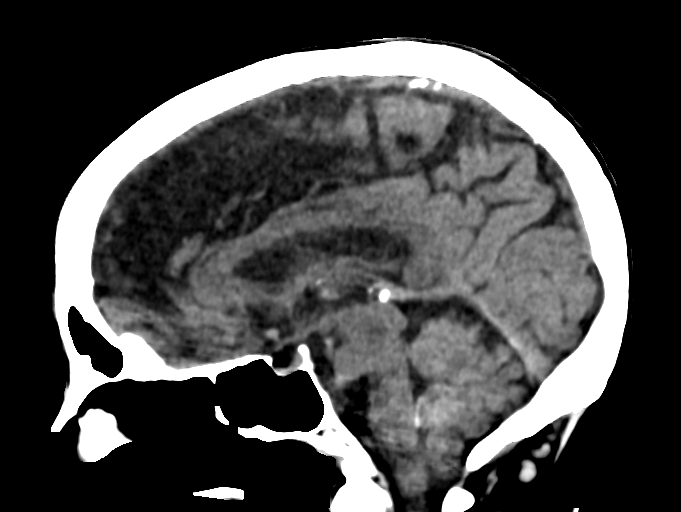
[im 37/56  brain]
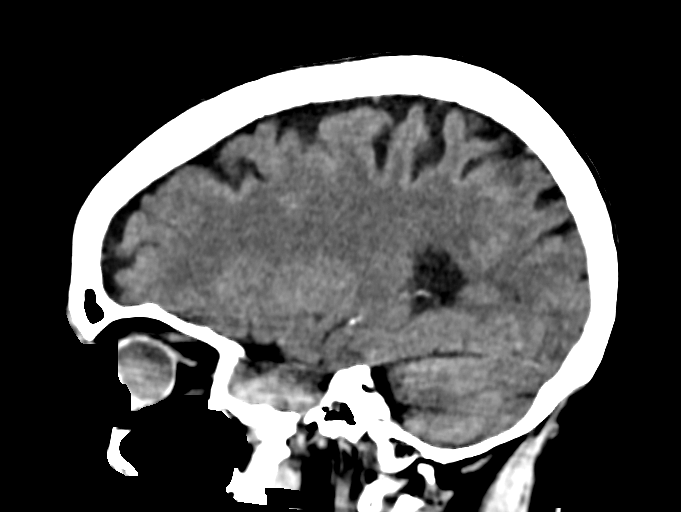

[Series 6: head 3.00 hr40 s3 cor · coronal · 0.32mm/px · 3 of 72 slices shown]
[im 24/72  brain]
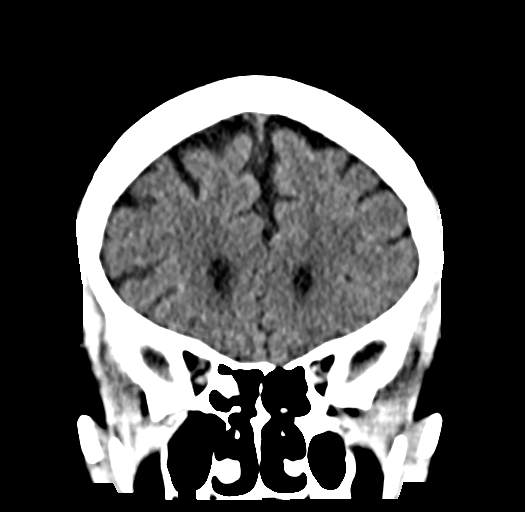
[im 32/72  brain]
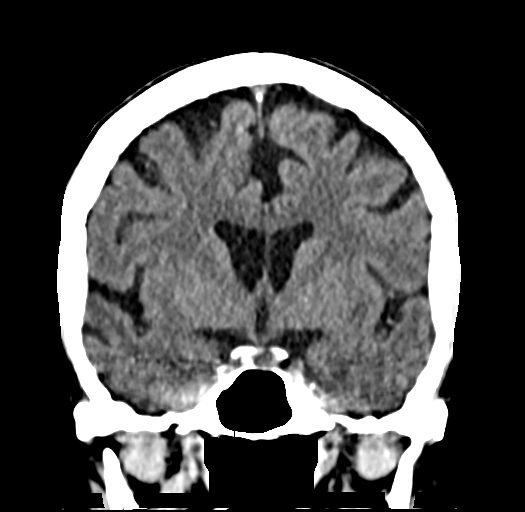
[im 40/72  brain]
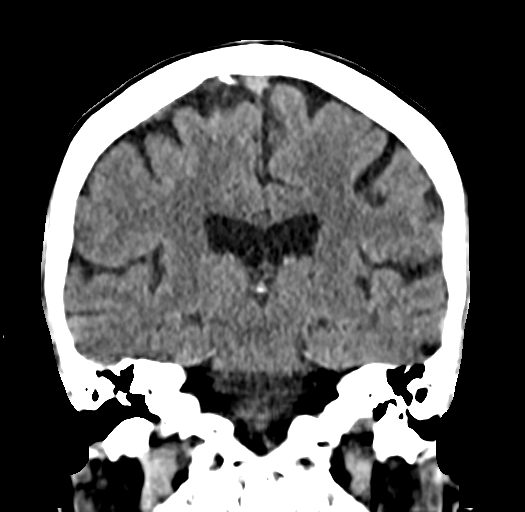

[16 of 47 positions shown; findings below may reference images not displayed]

FINDINGS: Brain: No acute territorial infarction, hemorrhage, or intracranial
mass. Mild atrophy. Minimal chronic small vessel ischemic changes of
the white matter. Non enlarged ventricles. Probable fissural cyst on
the left.

Vascular: No hyperdense vessels. Vertebral and carotid vascular
calcification.

Skull: Normal. Negative for fracture or focal lesion.

Sinuses/Orbits: No acute finding.

Other: None
IMPRESSION: 1. No CT evidence for acute intracranial abnormality.
2. Mild atrophy and minimal chronic small vessel ischemic changes of
the white matter.

These results will be called to the ordering clinician or
representative by the Radiologist Assistant, and communication
documented in the PACS or [REDACTED].

## 2024-04-09 DIAGNOSIS — I10 Essential (primary) hypertension: Secondary | ICD-10-CM | POA: Diagnosis not present

## 2024-04-09 DIAGNOSIS — N183 Chronic kidney disease, stage 3 unspecified: Secondary | ICD-10-CM | POA: Diagnosis not present

## 2024-04-09 DIAGNOSIS — I129 Hypertensive chronic kidney disease with stage 1 through stage 4 chronic kidney disease, or unspecified chronic kidney disease: Secondary | ICD-10-CM | POA: Diagnosis not present

## 2024-04-12 DIAGNOSIS — I10 Essential (primary) hypertension: Secondary | ICD-10-CM | POA: Diagnosis not present

## 2024-04-12 DIAGNOSIS — E038 Other specified hypothyroidism: Secondary | ICD-10-CM | POA: Diagnosis not present

## 2024-04-12 DIAGNOSIS — N183 Chronic kidney disease, stage 3 unspecified: Secondary | ICD-10-CM | POA: Diagnosis not present

## 2024-04-12 DIAGNOSIS — I129 Hypertensive chronic kidney disease with stage 1 through stage 4 chronic kidney disease, or unspecified chronic kidney disease: Secondary | ICD-10-CM | POA: Diagnosis not present

## 2024-04-12 DIAGNOSIS — M170 Bilateral primary osteoarthritis of knee: Secondary | ICD-10-CM | POA: Diagnosis not present

## 2024-04-12 DIAGNOSIS — D0512 Intraductal carcinoma in situ of left breast: Secondary | ICD-10-CM | POA: Diagnosis not present

## 2024-04-12 DIAGNOSIS — M1990 Unspecified osteoarthritis, unspecified site: Secondary | ICD-10-CM | POA: Diagnosis not present

## 2024-04-13 ENCOUNTER — Inpatient Hospital Stay: Payer: Medicare HMO | Admitting: Hematology and Oncology

## 2024-04-28 ENCOUNTER — Ambulatory Visit (INDEPENDENT_AMBULATORY_CARE_PROVIDER_SITE_OTHER): Admitting: Otolaryngology

## 2024-04-28 VITALS — BP 111/66 | HR 62

## 2024-04-28 DIAGNOSIS — H903 Sensorineural hearing loss, bilateral: Secondary | ICD-10-CM

## 2024-04-28 DIAGNOSIS — H6123 Impacted cerumen, bilateral: Secondary | ICD-10-CM | POA: Diagnosis not present

## 2024-04-28 DIAGNOSIS — H608X3 Other otitis externa, bilateral: Secondary | ICD-10-CM | POA: Diagnosis not present

## 2024-04-28 MED ORDER — MOMETASONE FUROATE 0.1 % EX CREA: TOPICAL_CREAM | CUTANEOUS | 3 refills | Status: AC

## 2024-04-29 ENCOUNTER — Ambulatory Visit (HOSPITAL_BASED_OUTPATIENT_CLINIC_OR_DEPARTMENT_OTHER)
Admission: EM | Admit: 2024-04-29 | Discharge: 2024-04-29 | Disposition: A | Discharge status: Home / Self Care | Attending: Family Medicine | Admitting: Family Medicine

## 2024-04-29 ENCOUNTER — Encounter (HOSPITAL_BASED_OUTPATIENT_CLINIC_OR_DEPARTMENT_OTHER): Payer: Self-pay

## 2024-04-29 DIAGNOSIS — F409 Phobic anxiety disorder, unspecified: Secondary | ICD-10-CM

## 2024-04-29 DIAGNOSIS — F5105 Insomnia due to other mental disorder: Secondary | ICD-10-CM | POA: Diagnosis not present

## 2024-04-29 MED ORDER — ESCITALOPRAM OXALATE 5 MG PO TABS: 5.0000 mg | ORAL_TABLET | Freq: Every day | ORAL | 0 refills | Status: DC

## 2024-04-29 NOTE — Discharge Instructions (Addendum)
 Anxiety and insomnia due to recent trauma: Provided E citalopram, 5 mg, 1 pill nightly to help improve sleep and reduce anxiety.  Patient needs to follow-up with primary care for further management of this concern.
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
## 2024-04-30 DIAGNOSIS — H608X3 Other otitis externa, bilateral: Secondary | ICD-10-CM | POA: Insufficient documentation

## 2024-04-30 DIAGNOSIS — H903 Sensorineural hearing loss, bilateral: Secondary | ICD-10-CM | POA: Insufficient documentation

## 2024-04-30 DIAGNOSIS — H6123 Impacted cerumen, bilateral: Secondary | ICD-10-CM | POA: Insufficient documentation

## 2024-04-30 NOTE — Progress Notes (Signed)
 Patient ID: Jessica Ayers, female   DOB: 1941/01/15, 83 y.o.   MRN: [id number]  Follow-up: Hearing loss New complaint: Itchy sensation in ears  HPI: The patient is an 83 year old female who returns today for follow-up evaluation.  She was last seen 1 year ago.  At that time, she was noted to have bilateral symmetric high-frequency sensorineural hearing loss, likely secondary to presbycusis.  The hearing amplification options were discussed.  She has never worn hearing aids.  The patient returns today reporting no significant change in her hearing.  However, she has noted frequent itchy sensation in the ears.  She denies any otalgia, otorrhea, or vertigo.  Exam: General: Communicates without difficulty, well nourished, no acute distress. Head: Normocephalic, no evidence injury, no tenderness, facial buttresses intact without stepoff. Face/sinus: No tenderness to palpation and percussion. Facial movement is normal and symmetric. Eyes: PERRL, EOMI. No scleral icterus, conjunctivae clear. Neuro: CN II exam reveals vision grossly intact.  No nystagmus at any point of gaze. Ears: Auricles well formed without lesions.  Bilateral cerumen impaction.  Nose: External evaluation reveals normal support and skin without lesions.  Dorsum is intact.  Anterior rhinoscopy reveals congested mucosa over anterior aspect of inferior turbinates and intact septum.  No purulence noted. Oral:  Oral cavity and oropharynx are intact, symmetric, without erythema or edema.  Mucosa is moist without lesions. Neck: Full range of motion without pain.  There is no significant lymphadenopathy.  No masses palpable.  Thyroid  bed within normal limits to palpation.  Parotid glands and submandibular glands equal bilaterally without mass.  Trachea is midline. Neuro:  CN 2-12 grossly intact.   Procedure: Bilateral cerumen disimpaction Anesthesia: None Description: Under the operating microscope, the cerumen is carefully removed with a combination  of cerumen currette, alligator forceps, and suction catheters.  After the cerumen is removed, the TMs are noted to be normal.  Eczematous changes are noted in both ear canals.  No mass, erythema, or lesions. The patient tolerated the procedure well.    Assessment: 1.  Bilateral recurrent cerumen impaction. 2.  Bilateral chronic eczematous otitis externa. 3.  Subjectively stable bilateral high-frequency sensorineural hearing loss.  Plan: 1.  Otomicroscopy with bilateral cerumen disimpaction. 2.  The physical exam findings are reviewed with the patient. 3.  Elocon  cream to treat the chronic eczematous otitis externa. 4.  Hearing amplification options are again discussed. 5.  The patient will return for reevaluation in 1 year.

## 2024-05-11 ENCOUNTER — Telehealth: Payer: Self-pay

## 2024-05-11 NOTE — Telephone Encounter (Signed)
 Pt verbally confirmed appt for 7/30

## 2024-05-12 ENCOUNTER — Inpatient Hospital Stay: Payer: Self-pay | Attending: Hematology and Oncology | Admitting: Hematology and Oncology

## 2024-05-12 VITALS — BP 139/50 | HR 59 | Resp 18 | Wt 123.4 lb

## 2024-05-12 DIAGNOSIS — Z9012 Acquired absence of left breast and nipple: Secondary | ICD-10-CM | POA: Diagnosis not present

## 2024-05-12 DIAGNOSIS — Z170 Estrogen receptor positive status [ER+]: Secondary | ICD-10-CM | POA: Diagnosis not present

## 2024-05-12 DIAGNOSIS — C50912 Malignant neoplasm of unspecified site of left female breast: Secondary | ICD-10-CM

## 2024-05-12 DIAGNOSIS — Z79811 Long term (current) use of aromatase inhibitors: Secondary | ICD-10-CM | POA: Diagnosis not present

## 2024-05-12 DIAGNOSIS — C50112 Malignant neoplasm of central portion of left female breast: Secondary | ICD-10-CM | POA: Diagnosis not present

## 2024-05-12 NOTE — Progress Notes (Unsigned)
 BRIEF ONCOLOGIC HISTORY:  Oncology History  Invasive ductal carcinoma of breast, female, left (HCC)  12/06/2022 Initial Biopsy   Left breast upper central core biopsy: IDC, g2, 1.71mm, DCIS 4mm, ER+, PR+, HER2 +   01/08/2023 Surgery   A. BREAST, LEFT, MASTECTOMY:  Ductal carcinoma in situ, solid and cribriform types, nuclear grade 2,  with necrosis  DCIS greatest dimension: 4 mm  Margins:  Negative            Closest margin: Superior margin (8 mm)  Prognostic markers: Performed on prior biopsy [id number]    01/2023 -  Anti-estrogen oral therapy   Anastrozole  x 5 years   04/15/2023 Cancer Staging   Staging form: Breast, AJCC 8th Edition - Clinical: Stage IA (cT1a, cN0, cM0, G2, ER+, PR+, HER2+) - Signed by Crawford Morna Pickle, NP on 04/15/2023 Stage prefix: Initial diagnosis Histologic grading system: 3 grade system     INTERVAL HISTORY:   Discussed the use of AI scribe software for clinical note transcription with the patient, who gave verbal consent to proceed.  History of Present Illness Jessica Ayers is an 83 year old female with breast cancer who presents for follow-up regarding her medication and hair thinning.  She has been taking anastrozole  for the past year and a half as part of her breast cancer treatment. She experiences hair thinning, which she attributes to the medication, and has been trying organic products to manage the hair loss. Her son mentions she is taking a hair, skin, and nails supplement, including biotin, for about two to three months without significant improvement in hair growth. She has a history of a mastectomy on the left breast. Besides hair thinning, she reported no major issues. She is overdue for her mammogram. Her son and grandson are with her. Her son is very talkative, had to redirect the conversation at times so I can focus on Ms Mchaffie's health. Breast: Denies any new nodularity, masses, tenderness, nipple changes, or nipple discharge.        PAST MEDICAL/SURGICAL HISTORY:  Past Medical History:  Diagnosis Date   Asthma    Benign essential hypertension    Esophageal reflux    Fibromyalgia    Hypothyroidism    Osteoarthritis    Osteopenia    Vitamin D deficiency    Past Surgical History:  Procedure Laterality Date   APPENDECTOMY     BREAST BIOPSY Left 12/06/2022   MM LT BREAST BX W LOC DEV EA AD LESION IMG BX SPEC STEREO GUIDE 12/06/2022 [facility]   BREAST BIOPSY Left 12/06/2022   MM LT BREAST BX W LOC DEV 1ST LESION IMAGE BX SPEC STEREO GUIDE 12/06/2022 [facility]   EYE SURGERY     METATARSAL OSTEOTOMY Left 12/30/2022   4TH LEFT   TOTAL MASTECTOMY Left 01/08/2023   Procedure: LEFT TOTAL MASTECTOMY;  Surgeon: Belinda Cough, MD;  Location: [facility] OR;  Service: General;  Laterality: Left;   TUBAL LIGATION       ALLERGIES:  Allergies  Allergen Reactions   Cyclobenzaprine     Unknown reaction   Lyrica [Pregabalin]     Unknown reaction   Prednisone     Unknown reaction    Codeine Itching, Nausea And Vomiting and Rash   Morphine And Codeine Itching, Nausea And Vomiting and Rash     CURRENT MEDICATIONS:  Outpatient Encounter Medications as of 05/12/2024  Medication Sig Note   albuterol  (VENTOLIN  HFA) 108 (90 Base) MCG/ACT inhaler Inhale 1-2 puffs into the lungs  every 4 (four) hours as needed for wheezing.    amLODipine  (NORVASC ) 10 MG tablet Take 10 mg by mouth daily.    anastrozole  (ARIMIDEX ) 1 MG tablet TAKE 1 TABLET EVERY DAY    aspirin EC 81 MG tablet Take 81 mg by mouth daily. Swallow whole. 01/01/2023: On hold for procedure    clotrimazole -betamethasone  (LOTRISONE ) cream APPLY TO AFFECTED AREA TWICE DAILY    dorzolamide -timolol  (COSOPT ) 2-0.5 % ophthalmic solution Place 1 drop into both eyes 2 (two) times daily.    escitalopram  (LEXAPRO ) 5 MG tablet Take 1 tablet (5 mg total) by mouth at bedtime.    furosemide (LASIX) 20 MG tablet Take 20 mg by mouth daily as needed for edema.    gabapentin   (NEURONTIN ) 100 MG capsule Take 100 mg by mouth 3 (three) times daily as needed (pain).    ibuprofen  (IBU) 800 MG tablet TAKE 1 TABLET (800 MG TOTAL) BY MOUTH EVERY 6 (SIX) HOURS AS NEEDED.    ketoconazole  (NIZORAL ) 2 % cream Apply 1 Application topically daily.    latanoprost  (XALATAN ) 0.005 % ophthalmic solution Place 1 drop into both eyes at bedtime.    levocetirizine (XYZAL ) 5 MG tablet TAKE 1 TABLET (5 MG TOTAL) BY MOUTH 2 (TWO) TIMES DAILY AS NEEDED FOR ALLERGIES (FOR BREAKTHROUGH SYMPTOMS). 01/01/2023: Pt ran out, needs to get more   levothyroxine  (SYNTHROID ) 25 MCG tablet Take 25 mcg by mouth daily before breakfast. 01/01/2023: On hold only if her level get lower .   levothyroxine  (SYNTHROID ) 50 MCG tablet Take 50 mcg by mouth daily before breakfast.    losartan  (COZAAR ) 50 MG tablet Take 50 mg by mouth daily.    meloxicam (MOBIC) 15 MG tablet Take 15 mg by mouth daily as needed for pain.    mometasone  (ELOCON ) 0.1 % cream Apply topically daily as needed for itch    nitroGLYCERIN (NITROSTAT) 0.4 MG SL tablet Place 0.4 mg under the tongue every 5 (five) minutes as needed for chest pain.    omeprazole (PRILOSEC) 20 MG capsule Take 20 mg by mouth daily.    oxyCODONE -acetaminophen  (PERCOCET) 5-325 MG tablet Take 1 tablet by mouth every 4 (four) hours as needed for severe pain. (Patient taking differently: Take 0.5 tablets by mouth every 4 (four) hours as needed for severe pain.)    potassium citrate  (UROCIT-K ) 10 MEQ (1080 MG) SR tablet Take 10 mEq by mouth daily. 01/01/2023: On hold    triamcinolone  (NASACORT ) 55 MCG/ACT AERO nasal inhaler Place 2 sprays into the nose daily.    vitamin B-12 (CYANOCOBALAMIN ) 500 MCG tablet Take 500 mcg by mouth daily. 01/01/2023: On hold for procedure    Vitamin D, Ergocalciferol, (DRISDOL) 1.25 MG (50000 UNIT) CAPS capsule Take 50,000 Units by mouth every Friday.    No facility-administered encounter medications on file as of 05/12/2024.     ONCOLOGIC FAMILY  HISTORY:  Family History  Problem Relation Age of Onset   CAD Father    Breast cancer Neg Hx      SOCIAL HISTORY:  Social History   Socioeconomic History   Marital status: Married    Spouse name: Not on file   Number of children: Not on file   Years of education: Not on file   Highest education level: Not on file  Occupational History   Not on file  Tobacco Use   Smoking status: Never   Smokeless tobacco: Never  Vaping Use   Vaping status: Never Used  Substance and Sexual Activity  Alcohol use: Not Currently   Drug use: Never   Sexual activity: Not on file  Other Topics Concern   Not on file  Social History Narrative   Not on file   Social Drivers of Health   Financial Resource Strain: Not on file  Food Insecurity: No Food Insecurity (01/09/2023)   Hunger Vital Sign    Worried About Running Out of Food in the Last Year: Never true    Ran Out of Food in the Last Year: Never true  Transportation Needs: No Transportation Needs (01/09/2023)   PRAPARE - Administrator, Civil Service (Medical): No    Lack of Transportation (Non-Medical): No  Physical Activity: Not on file  Stress: Not on file  Social Connections: Not on file  Intimate Partner Violence: Not At Risk (01/09/2023)   Humiliation, Afraid, Rape, and Kick questionnaire    Fear of Current or Ex-Partner: No    Emotionally Abused: No    Physically Abused: No    Sexually Abused: No     OBSERVATIONS/OBJECTIVE:   BP (!) 139/50 (BP Location: Left Arm, Patient Position: Sitting)   Pulse (!) 59   Resp 18   Wt 123 lb 6.4 oz (56 kg)   SpO2 98%   BMI 23.32 kg/m   She appears well. Hair thinning noted all over. No cervical or axillary adenopathy She is s/p left mastectomy. Right breast with no palpable masses.  No LE edema.  LABORATORY DATA:  None for this visit.  DIAGNOSTIC IMAGING:  None for this visit.   ASSESSMENT AND PLAN:  MsKahlen Boyde is a pleasant 83 y.o. female with Stage 1A left  breast invasive ductal carcinoma, ER+/PR+/HER2-, diagnosed in [place], treated with mastectomy and anti-estrogen therapy with anastrozole  beginning in 01/2023.   Assessment and Plan Assessment & Plan Estrogen receptor-positive breast cancer, status post left mastectomy, on anastrozole , with ongoing surveillance She has been on anastrozole  for 1.5 years. Discussed hair thinning as a side effect. Considered switch to tamoxifen, which may reduce hair thinning but has a small risk of deep vein thrombosis. She is hesitant to switch due to fear of blood clots. - Continue anastrozole . - Order mammogram for right breast. - Encourage continuation of current supplements for hair health.  Hair thinning secondary to anastrozole  Hair thinning likely due to anastrozole . She uses organic products and supplements for hair health. Discussed biotin and Nutrafol as potential supplements. Some of this is also likely due to aging.   Total encounter time:40 minutes*in face-to-face visit time, chart review, lab review, care coordination, order entry, and documentation of the encounter time.   *Total Encounter Time as defined by the Centers for Medicare and Medicaid Services includes, in addition to the face-to-face time of a patient visit (documented in the note above) non-face-to-face time: obtaining and reviewing outside history, ordering and reviewing medications, tests or procedures, care coordination (communications with other health care professionals or caregivers) and documentation in the medical record.

## 2024-05-13 DIAGNOSIS — N183 Chronic kidney disease, stage 3 unspecified: Secondary | ICD-10-CM | POA: Diagnosis not present

## 2024-05-13 DIAGNOSIS — M170 Bilateral primary osteoarthritis of knee: Secondary | ICD-10-CM | POA: Diagnosis not present

## 2024-05-13 DIAGNOSIS — I10 Essential (primary) hypertension: Secondary | ICD-10-CM | POA: Diagnosis not present

## 2024-05-13 DIAGNOSIS — D0512 Intraductal carcinoma in situ of left breast: Secondary | ICD-10-CM | POA: Diagnosis not present

## 2024-05-13 DIAGNOSIS — I129 Hypertensive chronic kidney disease with stage 1 through stage 4 chronic kidney disease, or unspecified chronic kidney disease: Secondary | ICD-10-CM | POA: Diagnosis not present

## 2024-05-13 DIAGNOSIS — M1990 Unspecified osteoarthritis, unspecified site: Secondary | ICD-10-CM | POA: Diagnosis not present

## 2024-05-13 DIAGNOSIS — E038 Other specified hypothyroidism: Secondary | ICD-10-CM | POA: Diagnosis not present

## 2024-06-01 ENCOUNTER — Ambulatory Visit
Admission: RE | Admit: 2024-06-01 | Discharge: 2024-06-01 | Disposition: A | Discharge status: Home / Self Care | Source: Ambulatory Visit | Attending: Hematology and Oncology

## 2024-06-01 DIAGNOSIS — Z1231 Encounter for screening mammogram for malignant neoplasm of breast: Secondary | ICD-10-CM | POA: Diagnosis not present

## 2024-06-01 DIAGNOSIS — C50912 Malignant neoplasm of unspecified site of left female breast: Secondary | ICD-10-CM

## 2024-06-02 DIAGNOSIS — I10 Essential (primary) hypertension: Secondary | ICD-10-CM | POA: Diagnosis not present

## 2024-06-02 DIAGNOSIS — I129 Hypertensive chronic kidney disease with stage 1 through stage 4 chronic kidney disease, or unspecified chronic kidney disease: Secondary | ICD-10-CM | POA: Diagnosis not present

## 2024-06-02 DIAGNOSIS — N183 Chronic kidney disease, stage 3 unspecified: Secondary | ICD-10-CM | POA: Diagnosis not present

## 2024-06-09 DIAGNOSIS — M810 Age-related osteoporosis without current pathological fracture: Secondary | ICD-10-CM | POA: Diagnosis not present

## 2024-06-09 DIAGNOSIS — E7800 Pure hypercholesterolemia, unspecified: Secondary | ICD-10-CM | POA: Diagnosis not present

## 2024-06-09 DIAGNOSIS — R600 Localized edema: Secondary | ICD-10-CM | POA: Diagnosis not present

## 2024-06-09 DIAGNOSIS — N183 Chronic kidney disease, stage 3 unspecified: Secondary | ICD-10-CM | POA: Diagnosis not present

## 2024-06-09 DIAGNOSIS — G629 Polyneuropathy, unspecified: Secondary | ICD-10-CM | POA: Diagnosis not present

## 2024-06-09 DIAGNOSIS — M51362 Other intervertebral disc degeneration, lumbar region with discogenic back pain and lower extremity pain: Secondary | ICD-10-CM | POA: Diagnosis not present

## 2024-06-09 DIAGNOSIS — I10 Essential (primary) hypertension: Secondary | ICD-10-CM | POA: Diagnosis not present

## 2024-06-09 DIAGNOSIS — I471 Supraventricular tachycardia, unspecified: Secondary | ICD-10-CM | POA: Diagnosis not present

## 2024-06-09 DIAGNOSIS — D0512 Intraductal carcinoma in situ of left breast: Secondary | ICD-10-CM | POA: Diagnosis not present

## 2024-06-10 DIAGNOSIS — H4053X3 Glaucoma secondary to other eye disorders, bilateral, severe stage: Secondary | ICD-10-CM | POA: Diagnosis not present

## 2024-06-13 DIAGNOSIS — I10 Essential (primary) hypertension: Secondary | ICD-10-CM | POA: Diagnosis not present

## 2024-06-13 DIAGNOSIS — I129 Hypertensive chronic kidney disease with stage 1 through stage 4 chronic kidney disease, or unspecified chronic kidney disease: Secondary | ICD-10-CM | POA: Diagnosis not present

## 2024-06-13 DIAGNOSIS — M170 Bilateral primary osteoarthritis of knee: Secondary | ICD-10-CM | POA: Diagnosis not present

## 2024-06-13 DIAGNOSIS — D0512 Intraductal carcinoma in situ of left breast: Secondary | ICD-10-CM | POA: Diagnosis not present

## 2024-06-13 DIAGNOSIS — N183 Chronic kidney disease, stage 3 unspecified: Secondary | ICD-10-CM | POA: Diagnosis not present

## 2024-06-13 DIAGNOSIS — E038 Other specified hypothyroidism: Secondary | ICD-10-CM | POA: Diagnosis not present

## 2024-06-13 DIAGNOSIS — M1990 Unspecified osteoarthritis, unspecified site: Secondary | ICD-10-CM | POA: Diagnosis not present

## 2024-06-18 DIAGNOSIS — R5383 Other fatigue: Secondary | ICD-10-CM | POA: Diagnosis not present

## 2024-06-18 DIAGNOSIS — R42 Dizziness and giddiness: Secondary | ICD-10-CM | POA: Diagnosis not present

## 2024-06-18 DIAGNOSIS — B370 Candidal stomatitis: Secondary | ICD-10-CM | POA: Diagnosis not present

## 2024-06-18 DIAGNOSIS — J029 Acute pharyngitis, unspecified: Secondary | ICD-10-CM | POA: Diagnosis not present

## 2024-06-18 DIAGNOSIS — R051 Acute cough: Secondary | ICD-10-CM | POA: Diagnosis not present

## 2024-06-18 DIAGNOSIS — R110 Nausea: Secondary | ICD-10-CM | POA: Diagnosis not present

## 2024-06-18 DIAGNOSIS — I10 Essential (primary) hypertension: Secondary | ICD-10-CM | POA: Diagnosis not present

## 2024-07-01 DIAGNOSIS — Z03818 Encounter for observation for suspected exposure to other biological agents ruled out: Secondary | ICD-10-CM | POA: Diagnosis not present

## 2024-07-01 DIAGNOSIS — U071 COVID-19: Secondary | ICD-10-CM | POA: Diagnosis not present

## 2024-07-01 DIAGNOSIS — B349 Viral infection, unspecified: Secondary | ICD-10-CM | POA: Diagnosis not present

## 2024-07-01 DIAGNOSIS — R051 Acute cough: Secondary | ICD-10-CM | POA: Diagnosis not present

## 2024-07-01 DIAGNOSIS — R6883 Chills (without fever): Secondary | ICD-10-CM | POA: Diagnosis not present

## 2024-07-01 DIAGNOSIS — J029 Acute pharyngitis, unspecified: Secondary | ICD-10-CM | POA: Diagnosis not present

## 2024-07-02 DIAGNOSIS — N183 Chronic kidney disease, stage 3 unspecified: Secondary | ICD-10-CM | POA: Diagnosis not present

## 2024-07-02 DIAGNOSIS — I10 Essential (primary) hypertension: Secondary | ICD-10-CM | POA: Diagnosis not present

## 2024-07-02 DIAGNOSIS — I129 Hypertensive chronic kidney disease with stage 1 through stage 4 chronic kidney disease, or unspecified chronic kidney disease: Secondary | ICD-10-CM | POA: Diagnosis not present

## 2024-07-13 DIAGNOSIS — I129 Hypertensive chronic kidney disease with stage 1 through stage 4 chronic kidney disease, or unspecified chronic kidney disease: Secondary | ICD-10-CM | POA: Diagnosis not present

## 2024-07-13 DIAGNOSIS — M1990 Unspecified osteoarthritis, unspecified site: Secondary | ICD-10-CM | POA: Diagnosis not present

## 2024-07-13 DIAGNOSIS — D0512 Intraductal carcinoma in situ of left breast: Secondary | ICD-10-CM | POA: Diagnosis not present

## 2024-07-13 DIAGNOSIS — M170 Bilateral primary osteoarthritis of knee: Secondary | ICD-10-CM | POA: Diagnosis not present

## 2024-07-13 DIAGNOSIS — E038 Other specified hypothyroidism: Secondary | ICD-10-CM | POA: Diagnosis not present

## 2024-07-13 DIAGNOSIS — N183 Chronic kidney disease, stage 3 unspecified: Secondary | ICD-10-CM | POA: Diagnosis not present

## 2024-07-13 DIAGNOSIS — I10 Essential (primary) hypertension: Secondary | ICD-10-CM | POA: Diagnosis not present

## 2024-07-22 DIAGNOSIS — S20229A Contusion of unspecified back wall of thorax, initial encounter: Secondary | ICD-10-CM | POA: Diagnosis not present

## 2024-07-22 DIAGNOSIS — Z7982 Long term (current) use of aspirin: Secondary | ICD-10-CM | POA: Diagnosis not present

## 2024-07-22 DIAGNOSIS — I10 Essential (primary) hypertension: Secondary | ICD-10-CM | POA: Diagnosis not present

## 2024-07-22 DIAGNOSIS — E785 Hyperlipidemia, unspecified: Secondary | ICD-10-CM | POA: Diagnosis not present

## 2024-07-22 DIAGNOSIS — S20224A Contusion of middle back wall of thorax, initial encounter: Secondary | ICD-10-CM | POA: Diagnosis not present

## 2024-07-22 DIAGNOSIS — E039 Hypothyroidism, unspecified: Secondary | ICD-10-CM | POA: Diagnosis not present

## 2024-07-23 DIAGNOSIS — E039 Hypothyroidism, unspecified: Secondary | ICD-10-CM | POA: Diagnosis not present

## 2024-08-06 ENCOUNTER — Other Ambulatory Visit: Payer: Self-pay | Admitting: Podiatry

## 2024-08-28 DIAGNOSIS — K219 Gastro-esophageal reflux disease without esophagitis: Secondary | ICD-10-CM | POA: Diagnosis not present

## 2024-08-28 DIAGNOSIS — J45909 Unspecified asthma, uncomplicated: Secondary | ICD-10-CM | POA: Diagnosis not present

## 2024-08-28 DIAGNOSIS — E039 Hypothyroidism, unspecified: Secondary | ICD-10-CM | POA: Diagnosis not present

## 2024-08-28 DIAGNOSIS — Z5948 Other specified lack of adequate food: Secondary | ICD-10-CM | POA: Diagnosis not present

## 2024-08-28 DIAGNOSIS — Z5941 Food insecurity: Secondary | ICD-10-CM | POA: Diagnosis not present

## 2024-08-28 DIAGNOSIS — N1831 Chronic kidney disease, stage 3a: Secondary | ICD-10-CM | POA: Diagnosis not present

## 2024-08-28 DIAGNOSIS — F409 Phobic anxiety disorder, unspecified: Secondary | ICD-10-CM | POA: Diagnosis not present

## 2024-08-28 DIAGNOSIS — Z7982 Long term (current) use of aspirin: Secondary | ICD-10-CM | POA: Diagnosis not present

## 2024-08-28 DIAGNOSIS — C50919 Malignant neoplasm of unspecified site of unspecified female breast: Secondary | ICD-10-CM | POA: Diagnosis not present

## 2024-08-28 DIAGNOSIS — M519 Unspecified thoracic, thoracolumbar and lumbosacral intervertebral disc disorder: Secondary | ICD-10-CM | POA: Diagnosis not present

## 2024-08-28 DIAGNOSIS — I129 Hypertensive chronic kidney disease with stage 1 through stage 4 chronic kidney disease, or unspecified chronic kidney disease: Secondary | ICD-10-CM | POA: Diagnosis not present

## 2024-08-28 DIAGNOSIS — M810 Age-related osteoporosis without current pathological fracture: Secondary | ICD-10-CM | POA: Diagnosis not present

## 2024-08-28 DIAGNOSIS — H409 Unspecified glaucoma: Secondary | ICD-10-CM | POA: Diagnosis not present

## 2024-08-28 DIAGNOSIS — Z79811 Long term (current) use of aromatase inhibitors: Secondary | ICD-10-CM | POA: Diagnosis not present

## 2024-08-28 DIAGNOSIS — G8929 Other chronic pain: Secondary | ICD-10-CM | POA: Diagnosis not present

## 2024-08-30 ENCOUNTER — Other Ambulatory Visit

## 2024-09-02 ENCOUNTER — Other Ambulatory Visit (HOSPITAL_BASED_OUTPATIENT_CLINIC_OR_DEPARTMENT_OTHER)

## 2024-09-02 ENCOUNTER — Ambulatory Visit (INDEPENDENT_AMBULATORY_CARE_PROVIDER_SITE_OTHER)
Admission: RE | Admit: 2024-09-02 | Discharge: 2024-09-02 | Disposition: A | Discharge status: Home / Self Care | Source: Ambulatory Visit | Attending: Hematology and Oncology | Admitting: Hematology and Oncology

## 2024-09-02 DIAGNOSIS — C50912 Malignant neoplasm of unspecified site of left female breast: Secondary | ICD-10-CM

## 2024-09-02 DIAGNOSIS — Z780 Asymptomatic menopausal state: Secondary | ICD-10-CM | POA: Diagnosis not present

## 2024-09-02 DIAGNOSIS — M810 Age-related osteoporosis without current pathological fracture: Secondary | ICD-10-CM | POA: Diagnosis not present

## 2024-09-07 DIAGNOSIS — H4053X3 Glaucoma secondary to other eye disorders, bilateral, severe stage: Secondary | ICD-10-CM | POA: Diagnosis not present

## 2024-09-15 ENCOUNTER — Ambulatory Visit: Payer: Self-pay | Admitting: Hematology and Oncology

## 2024-09-20 DIAGNOSIS — M810 Age-related osteoporosis without current pathological fracture: Secondary | ICD-10-CM | POA: Diagnosis not present

## 2024-09-20 DIAGNOSIS — E038 Other specified hypothyroidism: Secondary | ICD-10-CM | POA: Diagnosis not present

## 2024-09-22 NOTE — Progress Notes (Signed)
 Awaiting fax from Dr Vernon if her office is able to assist with Reclast

## 2024-09-28 ENCOUNTER — Telehealth: Payer: Self-pay | Admitting: Hematology and Oncology

## 2024-09-28 NOTE — Telephone Encounter (Signed)
 I called pt to review treatment plan for osteoporosis again No answer left a voicemail to call us  back to see if she wants us  to proceed with every 6 month zometa for osteoporosis Our nurse tried to reach out to the PCP team about reclast yearly, waiting for response from them I recommended that she call us  back so we can arrange for dental clearance and zometa.  Yaden Seith

## 2024-11-15 ENCOUNTER — Ambulatory Visit (INDEPENDENT_AMBULATORY_CARE_PROVIDER_SITE_OTHER): Admitting: Otolaryngology

## 2025-05-16 ENCOUNTER — Ambulatory Visit: Admitting: Adult Health
